# Patient Record
Sex: Male | Born: 1965 | Race: White | Hispanic: No | Marital: Married | Smoking: Never smoker
Health system: Southern US, Community
[De-identification: ages and names within clinical notes are randomized; demographics above are authoritative.]

## PROBLEM LIST (undated history)

## (undated) DIAGNOSIS — K8020 Calculus of gallbladder without cholecystitis without obstruction: Secondary | ICD-10-CM

## (undated) DIAGNOSIS — K219 Gastro-esophageal reflux disease without esophagitis: Secondary | ICD-10-CM

## (undated) HISTORY — PX: SHOULDER SURGERY: SHX246

## (undated) HISTORY — PX: HAND SURGERY: SHX662

## (undated) HISTORY — PX: VASECTOMY: SHX75

---

## 2000-10-23 ENCOUNTER — Encounter: Payer: Self-pay | Admitting: Emergency Medicine

## 2000-10-23 ENCOUNTER — Emergency Department (HOSPITAL_COMMUNITY): Admission: EM | Admit: 2000-10-23 | Discharge: 2000-10-23 | Payer: Self-pay | Admitting: Emergency Medicine

## 2002-12-17 ENCOUNTER — Ambulatory Visit (HOSPITAL_COMMUNITY): Admission: RE | Admit: 2002-12-17 | Discharge: 2002-12-17 | Payer: Self-pay | Admitting: Neurosurgery

## 2004-03-03 ENCOUNTER — Ambulatory Visit (HOSPITAL_BASED_OUTPATIENT_CLINIC_OR_DEPARTMENT_OTHER): Admission: RE | Admit: 2004-03-03 | Discharge: 2004-03-03 | Payer: Self-pay | Admitting: Orthopedic Surgery

## 2004-03-03 ENCOUNTER — Ambulatory Visit (HOSPITAL_COMMUNITY): Admission: RE | Admit: 2004-03-03 | Discharge: 2004-03-03 | Payer: Self-pay | Admitting: Orthopedic Surgery

## 2005-02-28 ENCOUNTER — Ambulatory Visit (HOSPITAL_COMMUNITY): Admission: RE | Admit: 2005-02-28 | Discharge: 2005-02-28 | Payer: Self-pay | Admitting: Family Medicine

## 2008-12-26 HISTORY — PX: OTHER SURGICAL HISTORY: SHX169

## 2011-01-16 ENCOUNTER — Encounter: Payer: Self-pay | Admitting: Orthopedic Surgery

## 2011-01-16 ENCOUNTER — Encounter: Payer: Self-pay | Admitting: Family Medicine

## 2013-01-03 ENCOUNTER — Ambulatory Visit: Payer: Self-pay | Admitting: Family Medicine

## 2013-01-03 ENCOUNTER — Ambulatory Visit: Payer: Self-pay

## 2013-01-03 VITALS — BP 119/63 | HR 88 | Temp 101.7°F | Resp 17 | Ht 68.0 in | Wt 207.0 lb

## 2013-01-03 DIAGNOSIS — R059 Cough, unspecified: Secondary | ICD-10-CM

## 2013-01-03 DIAGNOSIS — R05 Cough: Secondary | ICD-10-CM

## 2013-01-03 DIAGNOSIS — R509 Fever, unspecified: Secondary | ICD-10-CM

## 2013-01-03 DIAGNOSIS — J209 Acute bronchitis, unspecified: Secondary | ICD-10-CM

## 2013-01-03 LAB — POCT CBC
HCT, POC: 46.7 % (ref 43.5–53.7)
Lymph, poc: 3.1 (ref 0.6–3.4)
MCH, POC: 30.2 pg (ref 27–31.2)
MCHC: 33.2 g/dL (ref 31.8–35.4)
MCV: 90.9 fL (ref 80–97)
POC LYMPH PERCENT: 22.8 %L (ref 10–50)
RDW, POC: 13.8 %
WBC: 13.5 10*3/uL — AB (ref 4.6–10.2)

## 2013-01-03 LAB — POCT INFLUENZA A/B
Influenza A, POC: NEGATIVE
Influenza B, POC: NEGATIVE

## 2013-01-03 MED ORDER — AZITHROMYCIN 250 MG PO TABS: ORAL_TABLET | ORAL | Status: DC

## 2013-01-03 NOTE — Progress Notes (Signed)
[address]   [place], [place]  [address]   [phone]  Subjective:    Patient ID: Casey Hutchinson, male    DOB: 1966-02-11, 47 y.o.   MRN: [id number]  HPIThis 47 y.o. male presents for evaluation of fever, acute illness.  Onset five days ago.  Stomach bug 12/20/12 with vomiting, diarrhea x 5 days; all family members sick.  Felt better all week.  Onset of symptoms five days ago.  +fever Tmax 101.8.  +chills/sweats.  +HA.  No ear pain; mild sore throat.  +rhinorrhea; +nasal congestion. +coughing; +sore all over; +chest soreness.  +SOB with ambulation.  Coughing is worst symptom.  No flu vaccine.  Nyquil, Tylenol Cold.  PCP:  none   Review of Systems  Constitutional: Positive for fever, chills, diaphoresis and fatigue.  HENT: Positive for congestion, sore throat, rhinorrhea, voice change and postnasal drip. Negative for ear pain and trouble swallowing.   Respiratory: Positive for cough and shortness of breath.   Gastrointestinal: Negative for nausea, vomiting and diarrhea.  Skin: Negative for rash.  Neurological: Positive for headaches.        History reviewed. No pertinent past medical history.  Past Surgical History  Procedure Date  . Vasectomy     Prior to Admission medications   Medication Sig Start Date End Date Taking? Authorizing Provider  azithromycin (ZITHROMAX) 250 MG tablet Two tablets daily x 5 days 01/03/13   Ethelda Chick, MD    No Known Allergies  History   Social History  . Marital Status: Married    Spouse Name: N/A    Number of Children: N/A  . Years of Education: N/A   Occupational History  . Not on file.   Social History Main Topics  . Smoking status: Never Smoker   . Smokeless tobacco: Never Used  . Alcohol Use: No  . Drug Use: No  . Sexually Active: Not on file   Other Topics Concern  . Not on file   Social History Narrative  . No narrative on file    History reviewed. No pertinent family history.  Objective:   Physical Exam    Nursing note and vitals reviewed. Constitutional: He is oriented to person, place, and time. He appears well-developed and well-nourished.  Non-toxic appearance. He appears ill.  HENT:  Head: Normocephalic and atraumatic.  Right Ear: External ear normal.  Left Ear: External ear normal.  Nose: Nose normal.  Mouth/Throat: Oropharynx is clear and moist. No oropharyngeal exudate.  Eyes: Conjunctivae normal and EOM are normal. Pupils are equal, round, and reactive to light.  Neck: Normal range of motion. Neck supple.  Cardiovascular: Normal rate, regular rhythm and normal heart sounds.  Exam reveals no gallop and no friction rub.   No murmur heard. Pulmonary/Chest: Effort normal and breath sounds normal. He has no wheezes. He has no rales.  Lymphadenopathy:    He has cervical adenopathy.  Neurological: He is alert and oriented to person, place, and time.  Skin: Skin is warm and dry. No rash noted.  Psychiatric: He has a normal mood and affect. His behavior is normal. Judgment and thought content normal.    Results for orders placed in visit on 01/03/13  POCT INFLUENZA A/B      Component Value Range   Influenza A, POC Negative     Influenza B, POC Negative    POCT CBC      Component Value Range   WBC 13.5 (*) 4.6 - 10.2 K/uL  Lymph, poc 3.1  0.6 - 3.4   POC LYMPH PERCENT 22.8  10 - 50 %L   MID (cbc) 0.8  0 - 0.9   POC MID % 5.9  0 - 12 %M   POC Granulocyte 9.6 (*) 2 - 6.9   Granulocyte percent 71.3  37 - 80 %G   RBC 5.14  4.69 - 6.13 M/uL   Hemoglobin 15.5  14.1 - 18.1 g/dL   HCT, POC 65.7  84.6 - 53.7 %   MCV 90.9  80 - 97 fL   MCH, POC 30.2  27 - 31.2 pg   MCHC 33.2  31.8 - 35.4 g/dL   RDW, POC 96.2     Platelet Count, POC 254  142 - 424 K/uL   MPV 9.2  0 - 99.8 fL  GLUCOSE, POCT (MANUAL RESULT ENTRY)      Component Value Range   POC Glucose 108 (*) 70 - 99 mg/dl       [facility] reading (PRIMARY) by  Dr. Katrinka Blazing.  CXR: NAD.   Assessment & Plan:   1. Cough  POCT Influenza  A/B, POCT CBC, POCT glucose (manual entry), DG Chest 2 View  2. Fever  POCT Influenza A/B  3. Acute bronchitis       1.  Acute Bronchitis:  New.  Symptoms highly suggestive of influenza yet WBC count suggestive of bacterial etiology.  Treat empirically with Zithromax.  Supportive care with rest, fluids, Ibuprofen and/or Tylenol, Delsym.  No work with fever.  RTC for acute worsening.   Meds ordered this encounter  Medications  . azithromycin (ZITHROMAX) 250 MG tablet    Sig: Two tablets daily x 5 days    Dispense:  10 tablet    Refill:  0

## 2013-01-06 ENCOUNTER — Telehealth: Payer: Self-pay

## 2013-01-06 NOTE — Telephone Encounter (Signed)
Pt called stating that the over the counter cough medication is not working and would like something different called in   Best number [phone]

## 2013-01-07 ENCOUNTER — Telehealth: Payer: Self-pay

## 2013-01-07 NOTE — Telephone Encounter (Signed)
Wife is adding to the call to dr Katrinka Blazing that his lip is covered in cold sores would like to know if we can call in something for that also

## 2013-01-08 MED ORDER — HYDROCODONE-HOMATROPINE 5-1.5 MG/5ML PO SYRP: 5.0000 mL | ORAL_SOLUTION | Freq: Three times a day (TID) | ORAL | Status: DC | PRN

## 2013-01-08 NOTE — Telephone Encounter (Signed)
Faxed in Rx for cough med and [facility] for pt that it was done. Also advised them that since we have not treated pt for cold sores before, we can't send in a Rx, but he can try one of the OTC medications for this, or we will be happy to see him for this if he wants to RTC.

## 2013-01-08 NOTE — Telephone Encounter (Signed)
Hycodan prescription printed.  Does pt have a history of cold sores?  Have we treated this before?  There are OTC medications that may be helpful

## 2014-02-05 IMAGING — CR DG CHEST 2V
2 series · 2 of 2 positions shown · non-contrast
Comparison: 03/03/2004 and preliminary reading of Dr. Remache

CLINICAL DATA: Fever, cough

CHEST - 2 VIEW

[PA]
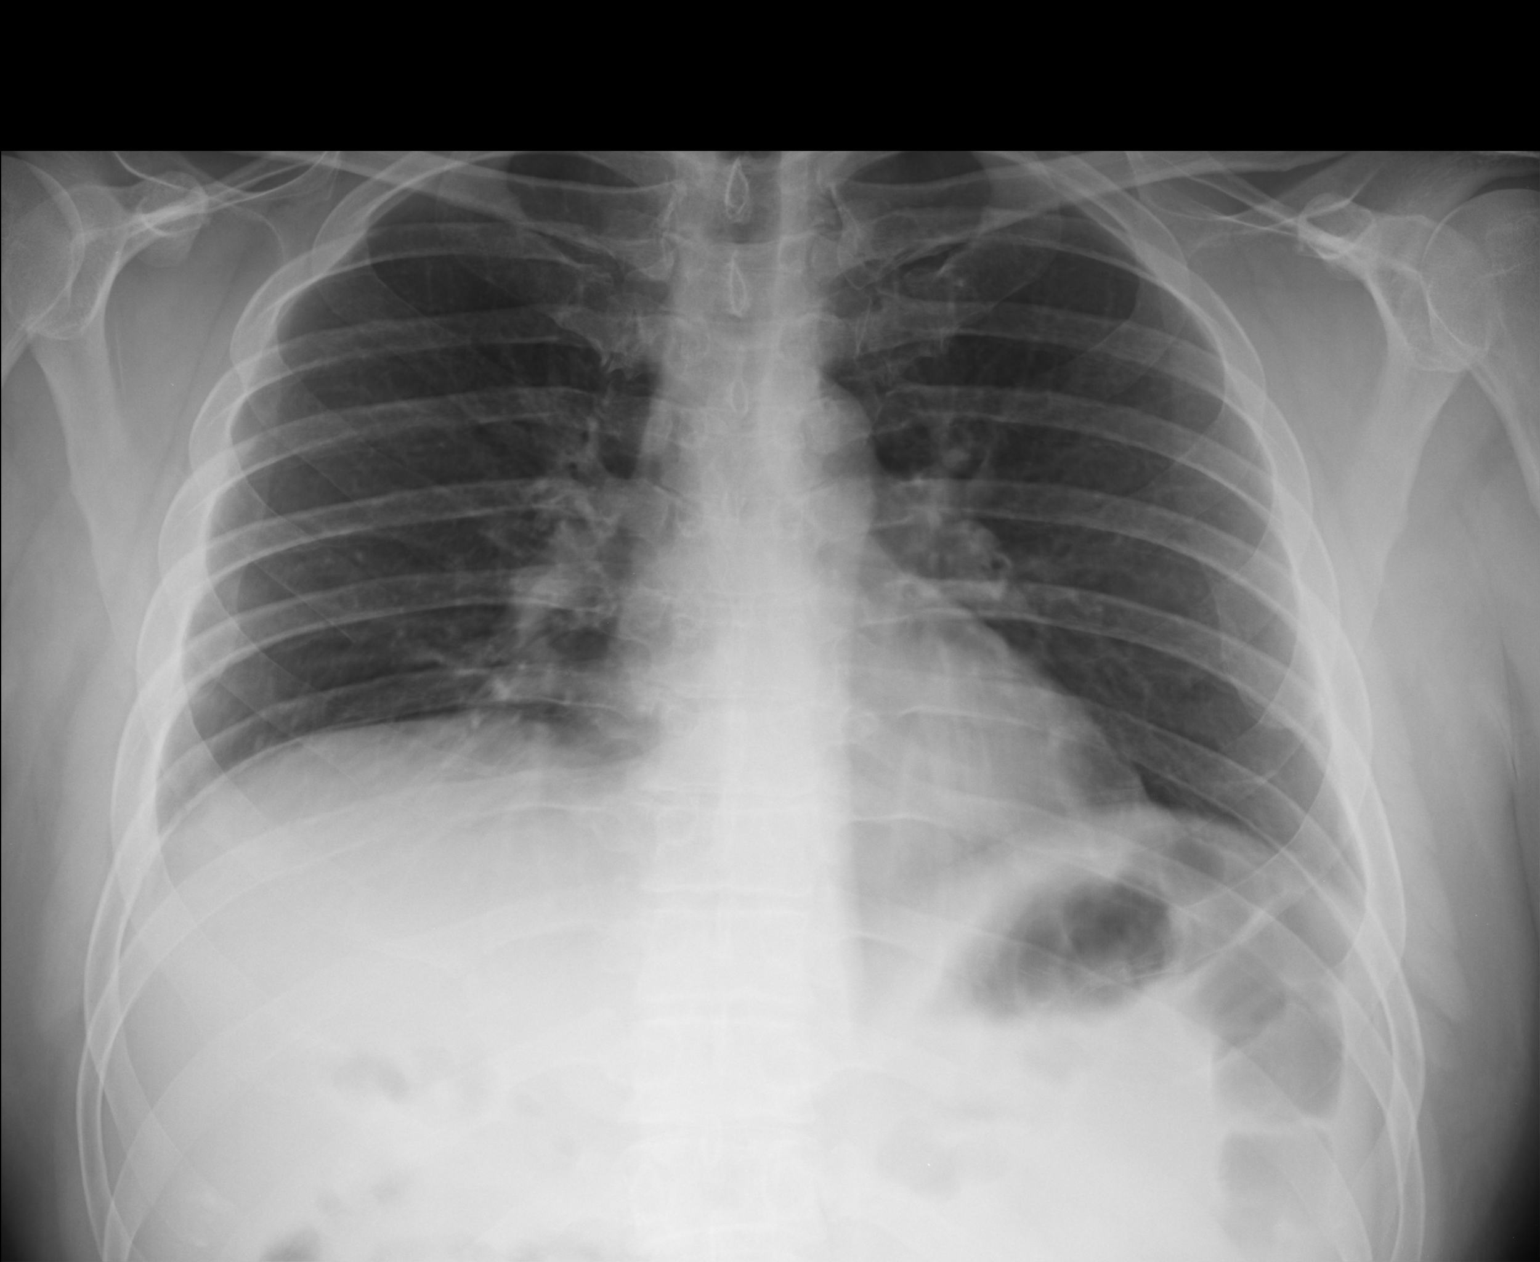

[lateral]
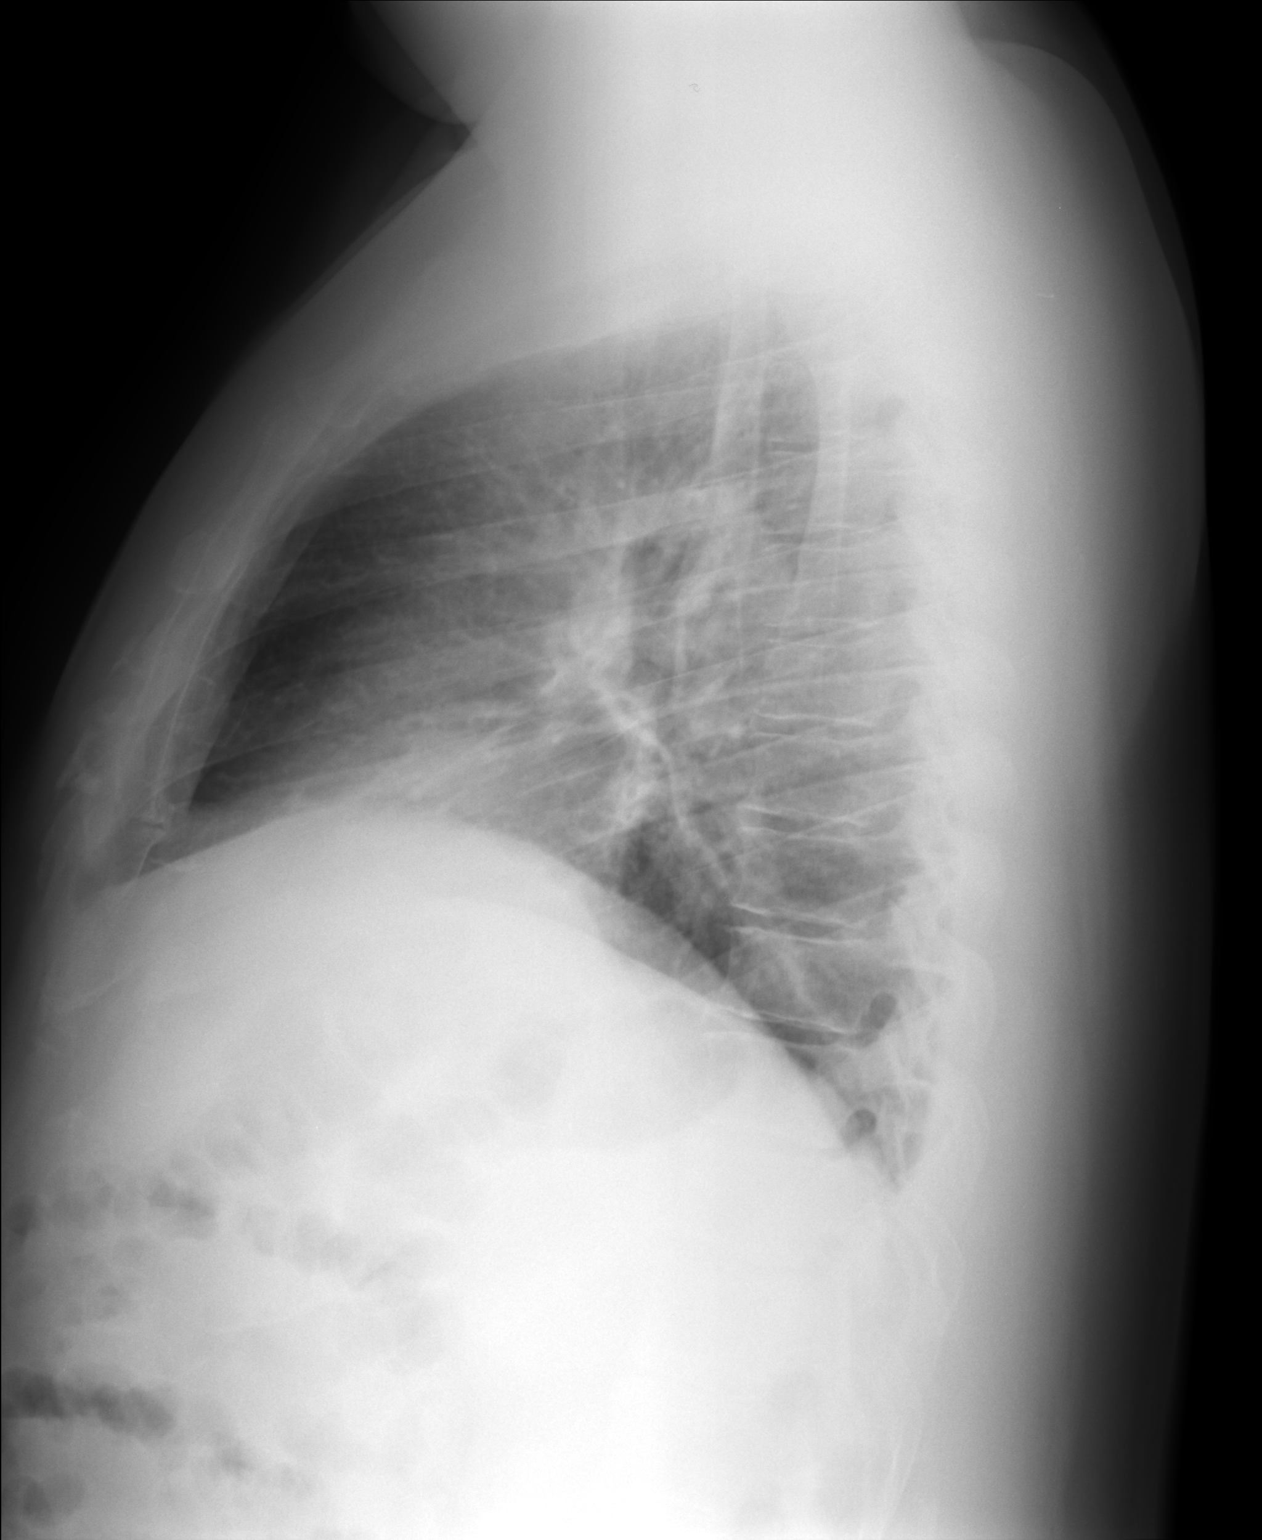

[2 of 2 positions shown; findings below may reference images not displayed]

FINDINGS: Cardiomediastinal silhouette is unremarkable.  No acute
infiltrate or pleural effusion.  No pulmonary edema.  Bony thorax
is unremarkable.
IMPRESSION: No active disease.

## 2014-05-18 ENCOUNTER — Emergency Department (HOSPITAL_BASED_OUTPATIENT_CLINIC_OR_DEPARTMENT_OTHER): Payer: 59

## 2014-05-18 ENCOUNTER — Emergency Department (HOSPITAL_BASED_OUTPATIENT_CLINIC_OR_DEPARTMENT_OTHER)
Admission: EM | Admit: 2014-05-18 | Discharge: 2014-05-18 | Disposition: A | Discharge status: Home / Self Care | Payer: 59 | Attending: Emergency Medicine | Admitting: Emergency Medicine

## 2014-05-18 ENCOUNTER — Encounter (HOSPITAL_BASED_OUTPATIENT_CLINIC_OR_DEPARTMENT_OTHER): Payer: Self-pay | Admitting: Emergency Medicine

## 2014-05-18 DIAGNOSIS — N23 Unspecified renal colic: Secondary | ICD-10-CM

## 2014-05-18 DIAGNOSIS — K8020 Calculus of gallbladder without cholecystitis without obstruction: Secondary | ICD-10-CM | POA: Insufficient documentation

## 2014-05-18 DIAGNOSIS — Z880 Allergy status to penicillin: Secondary | ICD-10-CM | POA: Insufficient documentation

## 2014-05-18 DIAGNOSIS — N200 Calculus of kidney: Secondary | ICD-10-CM | POA: Insufficient documentation

## 2014-05-18 LAB — CBC WITH DIFFERENTIAL/PLATELET
BASOS PCT: 0 % (ref 0–1)
Basophils Absolute: 0 10*3/uL (ref 0.0–0.1)
EOS ABS: 0.2 10*3/uL (ref 0.0–0.7)
Eosinophils Relative: 2 % (ref 0–5)
HCT: 43.2 % (ref 39.0–52.0)
Hemoglobin: 15.1 g/dL (ref 13.0–17.0)
Lymphocytes Relative: 35 % (ref 12–46)
Lymphs Abs: 2.6 10*3/uL (ref 0.7–4.0)
MCH: 31 pg (ref 26.0–34.0)
MCHC: 35 g/dL (ref 30.0–36.0)
MCV: 88.7 fL (ref 78.0–100.0)
MONO ABS: 0.5 10*3/uL (ref 0.1–1.0)
MONOS PCT: 7 % (ref 3–12)
NEUTROS PCT: 56 % (ref 43–77)
Neutro Abs: 4.1 10*3/uL (ref 1.7–7.7)
Platelets: 206 10*3/uL (ref 150–400)
RBC: 4.87 MIL/uL (ref 4.22–5.81)
RDW: 12.6 % (ref 11.5–15.5)
WBC: 7.3 10*3/uL (ref 4.0–10.5)

## 2014-05-18 LAB — COMPREHENSIVE METABOLIC PANEL
ALT: 46 U/L (ref 0–53)
AST: 36 U/L (ref 0–37)
Albumin: 4.1 g/dL (ref 3.5–5.2)
Alkaline Phosphatase: 51 U/L (ref 39–117)
BUN: 13 mg/dL (ref 6–23)
CALCIUM: 9.4 mg/dL (ref 8.4–10.5)
CO2: 24 meq/L (ref 19–32)
Chloride: 101 mEq/L (ref 96–112)
Creatinine, Ser: 0.9 mg/dL (ref 0.50–1.35)
GFR calc Af Amer: >90 mL/min (ref >90)
GFR calc non Af Amer: >90 mL/min (ref >90)
Glucose, Bld: 149 mg/dL — ABNORMAL HIGH (ref 70–99)
POTASSIUM: 4.4 meq/L (ref 3.7–5.3)
SODIUM: 139 meq/L (ref 137–147)
Total Bilirubin: 0.4 mg/dL (ref 0.3–1.2)
Total Protein: 7.6 g/dL (ref 6.0–8.3)

## 2014-05-18 LAB — URINALYSIS, ROUTINE W REFLEX MICROSCOPIC
Bilirubin Urine: NEGATIVE
Glucose, UA: NEGATIVE mg/dL
HGB URINE DIPSTICK: NEGATIVE
Ketones, ur: NEGATIVE mg/dL
LEUKOCYTES UA: NEGATIVE
NITRITE: NEGATIVE
PROTEIN: 30 mg/dL — AB
SPECIFIC GRAVITY, URINE: 1.027 (ref 1.005–1.030)
UROBILINOGEN UA: 0.2 mg/dL (ref 0.0–1.0)
pH: 5.5 (ref 5.0–8.0)

## 2014-05-18 LAB — URINE MICROSCOPIC-ADD ON

## 2014-05-18 LAB — TROPONIN I
Troponin I: <0.3 ng/mL (ref <0.30)
Troponin I: <0.3 ng/mL (ref <0.30)

## 2014-05-18 LAB — LIPASE, BLOOD: LIPASE: 56 U/L (ref 11–59)

## 2014-05-18 MED ORDER — ONDANSETRON HCL 4 MG/2ML IJ SOLN
4.0000 mg | Freq: Once | INTRAMUSCULAR | Status: AC
  Administered 2014-05-18: 4 mg via INTRAVENOUS
  Filled 2014-05-18: qty 2

## 2014-05-18 MED ORDER — ONDANSETRON HCL 4 MG PO TABS: 4.0000 mg | ORAL_TABLET | Freq: Four times a day (QID) | ORAL | Status: DC

## 2014-05-18 MED ORDER — ONDANSETRON HCL 4 MG/2ML IJ SOLN
4.0000 mg | Freq: Once | INTRAMUSCULAR | Status: AC
Start: 2014-05-18 — End: 2014-05-18
  Administered 2014-05-18: 4 mg via INTRAVENOUS
  Filled 2014-05-18: qty 2

## 2014-05-18 MED ORDER — HYDROMORPHONE HCL PF 1 MG/ML IJ SOLN: 0.5000 mg | Freq: Once | INTRAMUSCULAR | Status: DC

## 2014-05-18 MED ORDER — HYDROMORPHONE HCL PF 1 MG/ML IJ SOLN
1.0000 mg | Freq: Once | INTRAMUSCULAR | Status: DC
  Filled 2014-05-18: qty 1

## 2014-05-18 MED ORDER — IOHEXOL 300 MG/ML  SOLN
100.0000 mL | Freq: Once | INTRAMUSCULAR | Status: AC | PRN
  Administered 2014-05-18: 100 mL via INTRAVENOUS

## 2014-05-18 MED ORDER — MORPHINE SULFATE 4 MG/ML IJ SOLN
4.0000 mg | Freq: Once | INTRAMUSCULAR | Status: AC
  Administered 2014-05-18: 4 mg via INTRAVENOUS
  Filled 2014-05-18: qty 1

## 2014-05-18 MED ORDER — IOHEXOL 300 MG/ML  SOLN
50.0000 mL | Freq: Once | INTRAMUSCULAR | Status: AC | PRN
  Administered 2014-05-18: 50 mL via ORAL

## 2014-05-18 MED ORDER — HYDROMORPHONE HCL PF 1 MG/ML IJ SOLN
0.5000 mg | Freq: Once | INTRAMUSCULAR | Status: AC
  Administered 2014-05-18: 0.5 mg via INTRAVENOUS

## 2014-05-18 MED ORDER — HYDROCODONE-ACETAMINOPHEN 5-325 MG PO TABS: 1.0000 | ORAL_TABLET | Freq: Four times a day (QID) | ORAL | Status: DC | PRN

## 2014-05-18 NOTE — Discharge Instructions (Signed)
Please call and set up an appointment with general surgery-gallstones will need to be monitored closely Please call and set up an appointment with urology regarding kidney stones Please call and set up appointments with one of the providers on the list below for primary care physician Please rest and stay hydrated Please avoid any fat or greasy foods-please decreased intake of meats Please while on pain medications his be no drinking alcohol, driving, operating any heavy machinery if there is extra please disposer proper manner Please continue to monitor symptoms closely and if symptoms are to worsen or change (fever greater than 101, chills, chest pain, shortness of breath, difficulty breathing, numbness, tingling, worsening or changes to abdominal pain, pain in the right upper quadrant of the abdomen, changes to skin colored, yellowish tinge to skin color, inability to keep food or fluid down, blood in the stools, black tarry stools, diarrhea, weakness, fainting, dizziness, blurred vision, sudden loss of vision)   Abdominal Pain, Adult Many things can cause belly (abdominal) pain. Most times, the belly pain is not dangerous. Many cases of belly pain can be watched and treated at home. HOME CARE   Do not take medicines that help you go poop (laxatives) unless told to by your doctor.  Only take medicine as told by your doctor.  Eat or drink as told by your doctor. Your doctor will tell you if you should be on a special diet. GET HELP IF:  You do not know what is causing your belly pain.  You have belly pain while you are sick to your stomach (nauseous) or have runny poop (diarrhea).  You have pain while you pee or poop.  Your belly pain wakes you up at night.  You have belly pain that gets worse or better when you eat.  You have belly pain that gets worse when you eat fatty foods. GET HELP RIGHT AWAY IF:   The pain does not go away within 2 hours.  You have a fever.  You keep  throwing up (vomiting).  The pain changes and is only in the right or left part of the belly.  You have bloody or tarry looking poop. MAKE SURE YOU:   Understand these instructions.  Will watch your condition.  Will get help right away if you are not doing well or get worse. Document Released: 05/30/2008 Document Revised: 10/02/2013 Document Reviewed: 08/21/2013 [facility] Patient Information 2014 [place], [place].  Cholelithiasis Cholelithiasis (also called gallstones) is a form of gallbladder disease in which gallstones form in your gallbladder. The gallbladder is an organ that stores bile made in the liver, which helps digest fats. Gallstones begin as small crystals and slowly grow into stones. Gallstone pain occurs when the gallbladder spasms and a gallstone is blocking the duct. Pain can also occur when a stone passes out of the duct.  RISK FACTORS  Being male.   Having multiple pregnancies. Health care providers sometimes advise removing diseased gallbladders before future pregnancies.   Being obese.  Eating a diet heavy in fried foods and fat.   Being older than 60 years and increasing age.   Prolonged use of medicines containing male hormones.   Having diabetes mellitus.   Rapidly losing weight.   Having a family history of gallstones (heredity).  SYMPTOMS  Nausea.   Vomiting.  Abdominal pain.   Yellowing of the skin (jaundice).   Sudden pain. It may persist from several minutes to several hours.  Fever.   Tenderness to the touch. In  some cases, when gallstones do not move into the bile duct, people have no pain or symptoms. These are called "silent" gallstones.  TREATMENT Silent gallstones do not need treatment. In severe cases, emergency surgery may be required. Options for treatment include:  Surgery to remove the gallbladder. This is the most common treatment.  Medicines. These do not always work and may take 6 12 months or more to  work.  Shock wave treatment (extracorporeal biliary lithotripsy). In this treatment an ultrasound machine sends shock waves to the gallbladder to break gallstones into smaller pieces that can pass into the intestines or be dissolved by medicine. HOME CARE INSTRUCTIONS   Only take over-the-counter or prescription medicines for pain, discomfort, or fever as directed by your health care provider.   Follow a low-fat diet until seen again by your health care provider. Fat causes the gallbladder to contract, which can result in pain.   Follow up with your health care provider as directed. Attacks are almost always recurrent and surgery is usually required for permanent treatment.  SEEK IMMEDIATE MEDICAL CARE IF:   Your pain increases and is not controlled by medicines.   You have a fever or persistent symptoms for more than 2 3 days.   You have a fever and your symptoms suddenly get worse.   You have persistent nausea and vomiting.  MAKE SURE YOU:   Understand these instructions.  Will watch your condition.  Will get help right away if you are not doing well or get worse. Document Released: 12/08/2005 Document Revised: 08/14/2013 Document Reviewed: 06/05/2013 [facility] Patient Information 2014 [place]. Ureteral Colic Ureteral colic is spasm-like pain from the kidney or the ureter. This is often caused by a kidney stone. The pain is caused by the stone trying to get through the tubes that pass your pee. HOME CARE   Drink enough fluids to keep your pee (urine) clear or pale yellow.  Strain all your pee. A strainer will be provided. Keep anything caught in the strainer and bring it to your doctor. The stone causing the pain may be very small.  Only take medicine as told by your doctor.  Follow up with your doctor as told. GET HELP RIGHT AWAY IF:   Pain is not controlled with medicine.  Pain continues or gets worse.  The pain changes and there is chest or belly  (abdominal) pain.  You pass out (faint).  You cannot pee.  You keep throwing up (vomiting).  You have a temperature by mouth above 102 F (38.9 C), not controlled by medicine. MAKE SURE YOU:   Understand these instructions.  Will watch this condition.  Will get help right away if you are not doing well or get worse. Document Released: 05/30/2008 Document Revised: 03/05/2012 Document Reviewed: 05/30/2008 [facility] Patient Information 2014 [place], [place].   Emergency Department Resource Guide 1) Find a Doctor and Pay Out of Pocket Although you won't have to find out who is covered by your insurance plan, it is a good idea to ask around and get recommendations. You will then need to call the office and see if the doctor you have chosen will accept you as a new patient and what types of options they offer for patients who are self-pay. Some doctors offer discounts or will set up payment plans for their patients who do not have insurance, but you will need to ask so you aren't surprised when you get to your appointment.  2) Contact Your Local Health Department  Not all health departments have doctors that can see patients for sick visits, but many do, so it is worth a call to see if yours does. If you don't know where your local health department is, you can check in your phone book. The CDC also has a tool to help you locate your state's health department, and many state websites also have listings of all of their local health departments.  3) Find a Walk-in Clinic If your illness is not likely to be very severe or complicated, you may want to try a walk in clinic. These are popping up all over the country in pharmacies, drugstores, and shopping centers. They're usually staffed by nurse practitioners or physician assistants that have been trained to treat common illnesses and complaints. They're usually fairly quick and inexpensive. However, if you have serious medical issues or chronic  medical problems, these are probably not your best option.  No Primary Care Doctor: - Call Health Connect at  [phone] - they can help you locate a primary care doctor that  accepts your insurance, provides certain services, etc. - Physician Referral Service- [phone]  Chronic Pain Problems: Organization         Address  Phone   Notes  [facility]  [phone] Patients need to be referred by their primary care doctor.   Medication Assistance: Organization         Address  Phone   Notes  [facility] Medication [facility] [address] [place] [place], [place] [address] [phone] --Must be a resident of [facility] -- Must have NO insurance coverage whatsoever (no Medicaid/ Medicare, etc.) -- The pt. MUST have a primary care doctor that directs their care regularly and follows them in the community   Med[facility]ist  [phone]   [employer]  [phone]    Agencies that provide inexpensive medical care: Organization         Address  Phone   Notes  [facility]  [phone]   [facility]    [phone]   [facility] [address] [place], [place] [address] [phone]   [facility] [address], [place] [phone]   Planned Parenthood    [phone]   [facility]    [phone]   Community [facility] and [facility]  [address], [place] Phone:  [phone], Fax:  [phone] Hours of Operation:  9 am - 6 pm, M-F.  Also accepts Medicaid/Medicare and self-pay.  [facility]  [address], [place] Phone: [phone], Fax: [phone]. Hours of Operation:  8:30 am - 5:30 pm, M-F.  Also accepts Medicaid and self-pay.  [facility] [place] [address] [place] Phone: [phone]   [facility] [address] Natasha Bence [place], [place] [phone],  Ext. [phone] Mondays & Thursdays: 7-9 AM.  First 15 patients are seen on a first come, first serve basis.    Medicaid-accepting [facility] Providers:  Organization         Address  Phone   Notes  [facility] [address],  [phone] Also accepts self-pay patients.  [facility] [address] Laurell Josephs [place], [place]  [phone]   [facility] [address], [place] [phone]  [facility] [address] [place] [phone]   Renaye Rakers [address], [place]   [phone] Only accepts [place] patients after they have their name applied to their card.   Self-Pay (no insurance) in [facility]:  Organization         Address  Phone   Notes  Sickle Cell Patients, [facility] [address] [place], [place] [phone]   [facility] [address] [place], [place] [phone]   [facility]  [address],  [phone]   [facility]/Dr. Osei-Bonsu  [address] [place] or [address], [place] [phone] Phone number for both [place] and [place] locations is the same.  [facility]gent [facility] and [facility] [address] [place] [phone]   [facility] [address], [place] or [address] [phone]   [facility] [address] [place] [phone], phone; [phone], fax Sees patients 1st and 3rd Saturday of every month.  Must not qualify for public or private insurance (i.e. Medicaid, Medicare, [place] Health Choice, Veterans' Benefits)  Household income should be no more than 200% of the poverty level The clinic cannot treat you if you are pregnant or think you are pregnant  Sexually transmitted diseases are not  treated at the clinic.    Dental Care: Organization         Address  Phone  Notes  [facility] Department of [facility] [address] [place], [place] [phone] Accepts children up to age 22 who are enrolled in [place] or [place] Health Choice; pregnant women with a Medicaid card; and children who have applied for Medicaid or [place] Health Choice, but were declined, whose parents can pay a reduced fee at time of service.  [facility] Department of [facility]  [address], [place] [phone] Accepts children up to age 19 who are enrolled in [place] or [place] Health Choice; pregnant women with a Medicaid card; and children who have applied for Medicaid or [place] Health Choice, but were declined, whose parents can pay a reduced fee at time of service.  [facility]  [address] [place], [place] [phone] Patients are seen by appointment only. Walk-ins are not accepted. [facility] will see patients 40 years of age and older. Monday - Tuesday (8am-5pm) Most Wednesdays (8:30-5pm) $30 per visit, cash only  [facility]  [address], [facility] [phone] Patients are seen by appointment only. Walk-ins are not accepted. [facility] will see patients 102 years of age and older. One Wednesday Evening (Monthly: Volunteer Based).  $30 per visit, cash only  [employer]  [phone] for adults; Children under age 21, call Graduate Pediatric Dentistry at [phone]. Children aged 93-14, please call [phone] to request a pediatric application.  Dental services are provided in all areas of dental care including fillings, crowns and bridges, complete and partial dentures, implants, gum treatment, root canals, and extractions. Preventive care is also provided. Treatment is provided to both adults and children. Patients are selected via a lottery and there is  often a waiting list.   [facility] [address] [place]  [phone] [email or web address]  [facility] [address], [place], [place] [phone], Ext. [phone] Second and Fourth Thursday of each month, opens at 6:30 AM; Clinic ends at 9 AM.  Patients are seen on a first-come first-served basis, and a limited number are seen during each clinic.   [facility]  [address], [place], [place] [phone]   Eligibility Requirements You must have lived in [place], [place], or [place] counties for at least the last three months.   You cannot be eligible for state or federal sponsored [place] Cityhealthcare insurance, including CIGNA[place]eterans Administration, [place]icaid, or [employer]Medicare.   You generally cannot be eligible for healthcare insurance through your employer.    How to apply: Eligibility screenings are held every Tuesday and Wednesday afternoon from 1:00 pm until 4:00 pm. You do not need an appointment for the interview!  [facility] [address], [place], [place] [phone]   [facility]  [phone]   [facility]  [phone]   [facility]  [phone]    Behavioral Health Resources in the Community: Intensive Outpatient Programs Organization         Address  Phone  Notes  [facility] [address], [place], [place] [phone]   [facility] [address], [place], [place] [phone]   [facility] [address], [place], [place]  [phone]   [facility] [address],  [place], [place] [phone] or [phone]   Substance Abuse Resources Organization         Address  Phone  Notes  [facility]  [phone]   [facility]  [phone]   [facility]  [phone]   [facility]  [phone]   Residential & Outpatient Substance  Abuse Program  [phone]   Psychological Services Organization         Address  Phone  Notes  [facility]  [phone]- [phone]   [facility]  [phone]   [facility] [address], [place] [phone] or [phone]    Mobile Crisis Teams Organization         Address  Phone  Notes  [facility], Mobile Crisis Care Unit  [phone]   [facility]  [address] [place], [place] [phone]   Doristine LocksSharon DeEsch [address] [place] [phone]    Self-Help/Support Groups Organization         Address  Phone             Notes  [facility] [place] - variety of support groups  336- [phone] Call for more information  Narcotics Anonymous (NA), [facility] [address], [place]  2 meetings at this location   Statisticianesidential Treatment Programs Organization         Address  Phone  Notes  [facility] [address],    [place]  [phone]   [facility]  [address], [place] [phone], [place], [place] [phone]   [facility] [address], [place] [phone] Admissions: 8am-3pm M-F  [facility] [address]    [place], [place] [phone]   [facility] [address], [place], [place] [phone]   [facility] [address]  [place], [place] [phone]   [facility] - Intensive Outpatient [phone]  [address] [place], [place] [phone]   [facility] ([facility] [address]  [place], [place] [phone] or [phone]   [facility] ([facility]) [address] [place], [place] [phone] Accepts Medicaid  [facility] [address]  [place] [phone] Substance Abuse/Addiction Treatment   [facility] Organization          Address  Phone  Notes  [facility]  [phone]   Angie Fava, PhD [address] Ervin Knack [place], [place]   [phone] or [phone]   [facility]   [address] [place], [place] [phone]   [facility] [address] [place], [place] [phone] Insurance/Medicaid/sponsorship through [facility] and Families [address]                                    [place], [place] [phone] Therapy/tele-psych/case  [facility] [address], [place] [phone]    Dr. Lolly Mustache  [phone]   [facility]  [employer] [facility] Dept. 1) [address], [place] 2) [address], [place] 3)  [address], [place] [phone]  [phone]   [facility] Child Abuse Hotline [phone] or [phone] (After Hours)

## 2014-05-18 NOTE — ED Provider Notes (Signed)
The left upper quadrant pain since 4:30 AM today accompanied by mild nausea. He made himself vomit one time this morning. He had one episode of diarrhea today. No other associated symptoms. No fever. No treatment prior to coming in nothing makes symptoms better or worse. No chest pain no shortness of breath no sweatiness. Symptoms nonexertional On exam no distress heart regular rate and rhythm no murmurs lungs clear auscultation abdomen nondistended decreased bowel sounds tender left upper quadrant no guarding no rigidity no rebound genitalia normal male  Doug Sou, MD 05/18/14 1346

## 2014-05-18 NOTE — ED Notes (Signed)
Patient to radiology at this time.

## 2014-05-18 NOTE — ED Notes (Signed)
Patient was still c/o nausea.  Order received and administered.

## 2014-05-18 NOTE — ED Notes (Signed)
Patient here with epigastric pain that started this am 0400, no nausea on assessment but vomited x 1 early am. Had diarrhea earlier but now resolved. Reports that he feels bloated.

## 2014-05-18 NOTE — ED Notes (Signed)
Fluid Challenge. Ginger ale given. Pt tolerating well at this time.

## 2014-05-18 NOTE — ED Notes (Signed)
Patient is resting comfortably. 

## 2014-05-18 NOTE — ED Provider Notes (Signed)
CSN: [id number]     Arrival date & time 05/18/14  1138 History   First MD Initiated Contact with Patient 05/18/14 1201     Chief Complaint  Patient presents with  . Abdominal Pain     (Consider location/radiation/quality/duration/timing/severity/associated sxs/prior Treatment) The history is provided by the patient. No language interpreter was used.  Casey Hutchinson is a 48 year old male with no known significant past medical history presenting to the ED with abdominal pain that started approximately 4:00 AM this morning. Reported that the pain is localized to left upper quadrant as described as a constant pain rating pain 10 out of 10 that radiates towards his back. Stated he's use ibuprofen with minimal relief. Reported nausea and one episode of emesis earlier this morning-NB/NB. Reported one episode of diarrhea this morning. Reported that he was fine without any discomfort yesterday. Denied fever, chills, chest pain, shortness of breath, difficulty breathing, hematuria, melena, hematochezia, abdominal surgery. PCP none  History reviewed. No pertinent past medical history. Past Surgical History  Procedure Laterality Date  . Vasectomy     No family history on file. History  Substance Use Topics  . Smoking status: Never Smoker   . Smokeless tobacco: Never Used  . Alcohol Use: No    Review of Systems  Constitutional: Negative for fever and chills.  Respiratory: Negative for chest tightness and shortness of breath.   Cardiovascular: Negative for chest pain.  Gastrointestinal: Positive for nausea, vomiting and abdominal pain. Negative for diarrhea, constipation, blood in stool and anal bleeding.  Musculoskeletal: Negative for back pain and neck pain.  Neurological: Negative for dizziness and weakness.  All other systems reviewed and are negative.     Allergies  Penicillins  Home Medications   Prior to Admission medications   Not on File   BP 130/95  Pulse 59  Temp(Src)  98.2 F (36.8 C) (Oral)  Resp 16  Wt 210 lb (95.255 kg)  SpO2 97% Physical Exam  Nursing note and vitals reviewed. Constitutional: He is oriented to person, place, and time. He appears well-developed and well-nourished. No distress.  HENT:  Head: Normocephalic and atraumatic.  Mouth/Throat: Oropharynx is clear and moist. No oropharyngeal exudate.  Eyes: Conjunctivae and EOM are normal. Pupils are equal, round, and reactive to light. Right eye exhibits no discharge. Left eye exhibits no discharge.  Neck: Normal range of motion. Neck supple. No tracheal deviation present.  Cardiovascular: Normal rate, regular rhythm and normal heart sounds.   No murmur heard. Pulmonary/Chest: Effort normal and breath sounds normal. No respiratory distress. He has no wheezes. He has no rales.  Abdominal: Soft. Normal appearance and bowel sounds are normal. He exhibits no distension and no ascites. There is tenderness in the left upper quadrant. There is no rebound, no guarding and no CVA tenderness.  Negative abdominal distention Bowel sounds normoactive in all 4 quadrants Abdomen soft upon palpation Mild discomfort upon palpation to left upper quadrant Negative peritoneal signs Negative guarding or rigidity identified to the abdomen Negative CVA tenderness bilaterally  Musculoskeletal: Normal range of motion.  Full ROM to upper and lower extremities without difficulty noted, negative ataxia noted.  Lymphadenopathy:    He has no cervical adenopathy.  Neurological: He is alert and oriented to person, place, and time. No cranial nerve deficit. He exhibits normal muscle tone. Coordination normal.  Skin: Skin is warm and dry. No rash noted. He is not diaphoretic. No erythema.  Psychiatric: He has a normal mood and affect. His behavior is  normal. Thought content normal.    ED Course  Procedures (including critical care time)  1:43 PM Patient seen and assessed by attending physician - Dr. Lyn Hollingshead. As  per physician recommended CT abdomen and pelvis with contrast.   4:40 PM This provider reassess the patient. Patient reports that he is pain-free. Discussed labs and imaging in great detail.  Results for orders placed during the hospital encounter of 05/18/14  URINALYSIS, ROUTINE W REFLEX MICROSCOPIC      Result Value Ref Range   Color, Urine YELLOW  YELLOW   APPearance CLEAR  CLEAR   Specific Gravity, Urine 1.027  1.005 - 1.030   pH 5.5  5.0 - 8.0   Glucose, UA NEGATIVE  NEGATIVE mg/dL   Hgb urine dipstick NEGATIVE  NEGATIVE   Bilirubin Urine NEGATIVE  NEGATIVE   Ketones, ur NEGATIVE  NEGATIVE mg/dL   Protein, ur 30 (*) NEGATIVE mg/dL   Urobilinogen, UA 0.2  0.0 - 1.0 mg/dL   Nitrite NEGATIVE  NEGATIVE   Leukocytes, UA NEGATIVE  NEGATIVE  LIPASE, BLOOD      Result Value Ref Range   Lipase 56  11 - 59 U/L  COMPREHENSIVE METABOLIC PANEL      Result Value Ref Range   Sodium 139  137 - 147 mEq/L   Potassium 4.4  3.7 - 5.3 mEq/L   Chloride 101  96 - 112 mEq/L   CO2 24  19 - 32 mEq/L   Glucose, Bld 149 (*) 70 - 99 mg/dL   BUN 13  6 - 23 mg/dL   Creatinine, Ser 0.90  0.50 - 1.35 mg/dL   Calcium 9.4  8.4 - 10.5 mg/dL   Total Protein 7.6  6.0 - 8.3 g/dL   Albumin 4.1  3.5 - 5.2 g/dL   AST 36  0 - 37 U/L   ALT 46  0 - 53 U/L   Alkaline Phosphatase 51  39 - 117 U/L   Total Bilirubin 0.4  0.3 - 1.2 mg/dL   GFR calc non Af Amer >90  >90 mL/min   GFR calc Af Amer >90  >90 mL/min  CBC WITH DIFFERENTIAL      Result Value Ref Range   WBC 7.3  4.0 - 10.5 K/uL   RBC 4.87  4.22 - 5.81 MIL/uL   Hemoglobin 15.1  13.0 - 17.0 g/dL   HCT 43.2  39.0 - 52.0 %   MCV 88.7  78.0 - 100.0 fL   MCH 31.0  26.0 - 34.0 pg   MCHC 35.0  30.0 - 36.0 g/dL   RDW 12.6  11.5 - 15.5 %   Platelets 206  150 - 400 K/uL   Neutrophils Relative % 56  43 - 77 %   Neutro Abs 4.1  1.7 - 7.7 K/uL   Lymphocytes Relative 35  12 - 46 %   Lymphs Abs 2.6  0.7 - 4.0 K/uL   Monocytes Relative 7  3 - 12 %   Monocytes  Absolute 0.5  0.1 - 1.0 K/uL   Eosinophils Relative 2  0 - 5 %   Eosinophils Absolute 0.2  0.0 - 0.7 K/uL   Basophils Relative 0  0 - 1 %   Basophils Absolute 0.0  0.0 - 0.1 K/uL  URINE MICROSCOPIC-ADD ON      Result Value Ref Range   Bacteria, UA FEW (*) RARE   Crystals CA OXALATE CRYSTALS (*) NEGATIVE   Urine-Other MUCOUS PRESENT    TROPONIN  I      Result Value Ref Range   Troponin I <0.30  <0.30 ng/mL  TROPONIN I      Result Value Ref Range   Troponin I <0.30  <0.30 ng/mL    Labs Review Labs Reviewed  URINALYSIS, ROUTINE W REFLEX MICROSCOPIC - Abnormal; Notable for the following:    Protein, ur 30 (*)    All other components within normal limits  COMPREHENSIVE METABOLIC PANEL - Abnormal; Notable for the following:    Glucose, Bld 149 (*)    All other components within normal limits  URINE MICROSCOPIC-ADD ON - Abnormal; Notable for the following:    Bacteria, UA FEW (*)    Crystals CA OXALATE CRYSTALS (*)    All other components within normal limits  LIPASE, BLOOD  CBC WITH DIFFERENTIAL  TROPONIN I  TROPONIN I    Imaging Review US Abdomen Complete  05/18/2014   CLINICAL DATA:  Right upper quadrant pain.  Nausea and vomiting.  EXAM: ULTRASOUND ABDOMEN COMPLETE  COMPARISON:  CT abdomen earlier today.  FINDINGS: Gallbladder:  Echogenic focus at the neck of the gallbladder measuring 5 mm consistent with an gallstones. Wall thickness 2.8 mm. Negative sonographic Murphy's sign.  Common bile duct:  Diameter: Unable to measure due to large amount of bowel gas.  Liver:  Diffuse increased echogenicity without focal defects. Findings consistent with steatosis.  IVC:  Limited visualization.  No definite abnormalities.  Pancreas:  Obscured due to bowel gas.  Spleen:  Size and appearance within normal limits.  Right Kidney:  Length: 11.7 cm. Echogenicity within normal limits. No mass or hydronephrosis visualized.  Left Kidney:  Length: 11.7 cm. Echogenicity within normal limits. No mass or  hydronephrosis visualized.  Abdominal aorta:  No aneurysm visualized.  Other findings:  None.  Good agreement with prior CT.  IMPRESSION: Cholelithiasis without signs of acute cholecystitis. No pericholecystic fluid or gallbladder wall thickening. Negative sonographic Murphy's sign.  No visualized intrahepatic biliary ductal dilatation. Extrahepatic biliary tree not well seen due to gas.  Increased liver echogenicity consistent with steatosis.   Electronically Signed   By: Rolla Flatten M.D.   On: 05/18/2014 16:10   Ct Abdomen Pelvis W Contrast  05/18/2014   CLINICAL DATA:  48 year old male with left-sided abdominal and pelvic pain.  EXAM: CT ABDOMEN AND PELVIS WITH CONTRAST  TECHNIQUE: Multidetector CT imaging of the abdomen and pelvis was performed using the standard protocol following bolus administration of intravenous contrast.  CONTRAST:  142m OMNIPAQUE IOHEXOL 300 MG/ML  SOLN  COMPARISON:  02/28/2005 CT  FINDINGS: Diffuse fatty infiltration of the liver is again noted. No focal hepatic abnormalities are identified.  A 5 mm per gallstone at the gallbladder neck is identified without definite CT evidence of acute cholecystitis.  The spleen, pancreas, adrenal glands and kidneys are unremarkable.  There is no evidence of free fluid, enlarged lymph nodes, biliary dilation or abdominal aortic aneurysm.  The bowel, bladder and appendix are unremarkable. There is no evidence of bowel obstruction, abscess or pneumoperitoneum.  No acute or suspicious bony abnormalities are present. Moderate degenerative changes at L5-S1 noted.  IMPRESSION: 5 mm gallstone at the gallbladder neck without CT evidence of acute cholecystitis. If there is strong clinical suspicion for acute cholecystitis, consider ultrasound or nuclear medicine study for further evaluation.  Diffuse fatty infiltration of the liver.   Electronically Signed   By: JHassan RowanM.D.   On: 05/18/2014 15:07     EKG Interpretation   Date/Time:  Sunday May 18 2014 11:56:40 EDT Ventricular Rate:  59 PR Interval:  144 QRS Duration: 84 QT Interval:  414 QTC Calculation: 409 R Axis:   3 Text Interpretation:  Sinus bradycardia Otherwise normal ECG Since last  tracing rate slower Confirmed by Winfred Leeds  MD, SAM [phone]) on 05/18/2014  12:21:57 PM      MDM   Final diagnoses:  Cholelithiasis  Renal colic   Medications  morphine 4 MG/ML injection 4 mg (4 mg Intravenous Given 05/18/14 1303)  ondansetron (ZOFRAN) injection 4 mg (4 mg Intravenous Given 05/18/14 1303)  ondansetron (ZOFRAN) injection 4 mg (4 mg Intravenous Given 05/18/14 1350)  HYDROmorphone (DILAUDID) injection 0.5 mg (0.5 mg Intravenous Given 05/18/14 1350)  iohexol (OMNIPAQUE) 300 MG/ML solution 50 mL (50 mLs Oral Contrast Given 05/18/14 1452)  iohexol (OMNIPAQUE) 300 MG/ML solution 100 mL (100 mLs Intravenous Contrast Given 05/18/14 1452)  ondansetron (ZOFRAN) injection 4 mg (4 mg Intravenous Given 05/18/14 1657)    Filed Vitals:   05/18/14 1150 05/18/14 1402 05/18/14 1636  BP: 147/87 127/82 130/95  Pulse: 70 56 59  Temp: 98.4 F (36.9 C)  98.2 F (36.8 C)  TempSrc:   Oral  Resp: _0 Weight: 210 lb (95.255 kg)    SpO2: 100% 96% 97%    EKG noted sinus bradycardia with a heart rate of 59 beats per minute. Troponin negative elevation. Second troponin negative elevation. CBC negative elevated white blood cell count is-negative left shift or leukocytosis noted. CMP noted probably functioning kidneys and liver. Electrolytes balanced. Lipase negative elevation. Urinalysis negative for nitrites, leukocytes, hemoglobin. Urine noted oxalate crystals. CT abdomen and pelvis noted a 5 mm gallstone at the gallbladder neck without CT evidence of acute cholecystitis-recommended ultrasound to be performed. Diffuse fatty infiltration of the liver noted. Abdominal ultrasound identified cholelithiasis without signs of acute cholecystitis-no pericholecystic fluid or gallbladder wall thickening  noted with negative findings of Murphy sign. No visualized intrahepatic biliary ductal dilation, extrahepatic biliary tree not well seen due to gas, increased liver echogenicity consistent with stenosis. Doubt cholangitis. Doubt pancreatitis. Doubt appendicitis. Doubt acute abdominal processes. Doubt cholecystitis-gallstones noted but patient afebrile, negative elevated white blood cell count, negative elevated liver enzymes, negative elevated bilirubin, negative elevated alk phos, no Murphy sign noted. Patient presents with incidental cholelithiasis. Cannot rule out possible kidney stones secondary to oxalate crystals identified in the urine. Pain controlled in ED setting. Patient stable, afebrile. Patient does not appear septic. Patient tolerated fluids by mouth without episodes of emesis while in ED setting. Discharged patient. Referred patient to [facility], surgery, gastroenterology. Discussed with patient to rest and stay hydrated. Discussed with patient proper diet. Discussed with patient close monitoring of gallbladder and consequences of cholecystitis that can be fatal. Discharged patient with pain medications and antiemetics. Discussed with patient to closely monitor symptoms and if symptoms are to worsen or change to report back to the ED - strict return instructions given.  Patient agreed to plan of care, understood, all questions answered.   Jamse Mead, PA-C 05/19/14 0126

## 2014-05-18 NOTE — ED Notes (Signed)
Family at bedside. 

## 2014-05-18 NOTE — ED Notes (Signed)
Patient transported to CT 

## 2014-05-19 NOTE — ED Provider Notes (Signed)
Medical screening examination/treatment/procedure(s) were conducted as a shared visit with non-physician practitioner(s) and myself.  I personally evaluated the patient during the encounter.   EKG Interpretation   Date/Time:  Sunday May 18 2014 11:56:40 EDT Ventricular Rate:  59 PR Interval:  144 QRS Duration: 84 QT Interval:  414 QTC Calculation: 409 R Axis:   3 Text Interpretation:  Sinus bradycardia Otherwise normal ECG Since last  tracing rate slower Confirmed by Ethelda Chick  MD,  [phone]) on 05/18/2014  12:21:57 PM       Doug Sou, MD 05/19/14 [phone]

## 2014-06-06 ENCOUNTER — Ambulatory Visit (INDEPENDENT_AMBULATORY_CARE_PROVIDER_SITE_OTHER): Payer: 59 | Admitting: Surgery

## 2014-07-22 ENCOUNTER — Other Ambulatory Visit (HOSPITAL_COMMUNITY): Payer: Self-pay | Admitting: Gastroenterology

## 2014-07-22 DIAGNOSIS — R109 Unspecified abdominal pain: Secondary | ICD-10-CM

## 2014-07-29 ENCOUNTER — Encounter (HOSPITAL_COMMUNITY)
Admission: RE | Admit: 2014-07-29 | Discharge: 2014-07-29 | Disposition: A | Discharge status: Home / Self Care | Payer: 59 | Source: Ambulatory Visit | Attending: Gastroenterology | Admitting: Gastroenterology

## 2014-07-29 DIAGNOSIS — R109 Unspecified abdominal pain: Secondary | ICD-10-CM | POA: Diagnosis not present

## 2014-07-29 MED ORDER — SINCALIDE 5 MCG IJ SOLR
INTRAMUSCULAR | Status: AC
  Administered 2014-07-29: 1.9 ug via INTRAVENOUS
  Filled 2014-07-29: qty 5

## 2014-07-29 MED ORDER — SINCALIDE 5 MCG IJ SOLR
0.0200 ug/kg | Freq: Once | INTRAMUSCULAR | Status: AC
  Administered 2014-07-29: 1.9 ug via INTRAVENOUS

## 2014-07-29 MED ORDER — TECHNETIUM TC 99M MEBROFENIN IV KIT
5.0000 | PACK | Freq: Once | INTRAVENOUS | Status: AC | PRN
  Administered 2014-07-29: 5 via INTRAVENOUS

## 2014-07-29 MED ORDER — STERILE WATER FOR INJECTION IJ SOLN
INTRAMUSCULAR | Status: AC
  Administered 2014-07-29: 10 mL
  Filled 2014-07-29: qty 10

## 2014-08-04 ENCOUNTER — Observation Stay (HOSPITAL_COMMUNITY)
Admission: EM | Admit: 2014-08-04 | Discharge: 2014-08-06 | Disposition: A | Discharge status: Home / Self Care | Payer: 59 | Attending: General Surgery | Admitting: General Surgery

## 2014-08-04 ENCOUNTER — Encounter (HOSPITAL_COMMUNITY): Payer: Self-pay | Admitting: Emergency Medicine

## 2014-08-04 ENCOUNTER — Ambulatory Visit (INDEPENDENT_AMBULATORY_CARE_PROVIDER_SITE_OTHER): Payer: 59 | Admitting: General Surgery

## 2014-08-04 ENCOUNTER — Encounter (INDEPENDENT_AMBULATORY_CARE_PROVIDER_SITE_OTHER): Payer: Self-pay | Admitting: General Surgery

## 2014-08-04 VITALS — BP 122/80 | HR 72 | Resp 14 | Ht 68.0 in | Wt 211.8 lb

## 2014-08-04 DIAGNOSIS — K8010 Calculus of gallbladder with chronic cholecystitis without obstruction: Principal | ICD-10-CM | POA: Diagnosis present

## 2014-08-04 DIAGNOSIS — K8020 Calculus of gallbladder without cholecystitis without obstruction: Secondary | ICD-10-CM

## 2014-08-04 DIAGNOSIS — K811 Chronic cholecystitis: Secondary | ICD-10-CM | POA: Diagnosis present

## 2014-08-04 DIAGNOSIS — Z880 Allergy status to penicillin: Secondary | ICD-10-CM | POA: Insufficient documentation

## 2014-08-04 HISTORY — DX: Gastro-esophageal reflux disease without esophagitis: K21.9

## 2014-08-04 HISTORY — DX: Calculus of gallbladder without cholecystitis without obstruction: K80.20

## 2014-08-04 LAB — CBC
HCT: 40 % (ref 39.0–52.0)
Hemoglobin: 13.7 g/dL (ref 13.0–17.0)
MCH: 30.4 pg (ref 26.0–34.0)
MCHC: 34.3 g/dL (ref 30.0–36.0)
MCV: 88.9 fL (ref 78.0–100.0)
Platelets: 176 10*3/uL (ref 150–400)
RBC: 4.5 MIL/uL (ref 4.22–5.81)
RDW: 12.9 % (ref 11.5–15.5)
WBC: 5.8 10*3/uL (ref 4.0–10.5)

## 2014-08-04 LAB — COMPREHENSIVE METABOLIC PANEL
ALBUMIN: 3.7 g/dL (ref 3.5–5.2)
ALT: 59 U/L — ABNORMAL HIGH (ref 0–53)
ANION GAP: 12 (ref 5–15)
AST: 59 U/L — ABNORMAL HIGH (ref 0–37)
Alkaline Phosphatase: 36 U/L — ABNORMAL LOW (ref 39–117)
BUN: 12 mg/dL (ref 6–23)
CO2: 24 mEq/L (ref 19–32)
CREATININE: 0.81 mg/dL (ref 0.50–1.35)
Calcium: 8.6 mg/dL (ref 8.4–10.5)
Chloride: 103 mEq/L (ref 96–112)
GFR calc Af Amer: >90 mL/min (ref >90)
GFR calc non Af Amer: >90 mL/min (ref >90)
Glucose, Bld: 111 mg/dL — ABNORMAL HIGH (ref 70–99)
Potassium: 5.6 mEq/L — ABNORMAL HIGH (ref 3.7–5.3)
Sodium: 139 mEq/L (ref 137–147)
TOTAL PROTEIN: 6.9 g/dL (ref 6.0–8.3)
Total Bilirubin: 0.5 mg/dL (ref 0.3–1.2)

## 2014-08-04 LAB — URINALYSIS, ROUTINE W REFLEX MICROSCOPIC
Bilirubin Urine: NEGATIVE
GLUCOSE, UA: NEGATIVE mg/dL
Hgb urine dipstick: NEGATIVE
Ketones, ur: NEGATIVE mg/dL
LEUKOCYTES UA: NEGATIVE
NITRITE: NEGATIVE
PH: 7.5 (ref 5.0–8.0)
Protein, ur: NEGATIVE mg/dL
SPECIFIC GRAVITY, URINE: 1.027 (ref 1.005–1.030)
Urobilinogen, UA: 1 mg/dL (ref 0.0–1.0)

## 2014-08-04 LAB — TYPE AND SCREEN
ABO/RH(D): A POS
Antibody Screen: NEGATIVE

## 2014-08-04 LAB — SURGICAL PCR SCREEN
MRSA, PCR: NEGATIVE
Staphylococcus aureus: NEGATIVE

## 2014-08-04 LAB — HCG, SERUM, QUALITATIVE: PREG SERUM: NEGATIVE

## 2014-08-04 LAB — ABO/RH: ABO/RH(D): A POS

## 2014-08-04 LAB — LIPASE, BLOOD: LIPASE: 55 U/L (ref 11–59)

## 2014-08-04 MED ORDER — OXYMETAZOLINE HCL 0.05 % NA SOLN
2.0000 | Freq: Once | NASAL | Status: DC
  Filled 2014-08-04: qty 15

## 2014-08-04 MED ORDER — SODIUM CHLORIDE 0.9 % IV SOLN
INTRAVENOUS | Status: DC
  Administered 2014-08-04: 12:00:00 via INTRAVENOUS

## 2014-08-04 MED ORDER — MORPHINE SULFATE 4 MG/ML IJ SOLN
4.0000 mg | INTRAMUSCULAR | Status: DC | PRN
  Administered 2014-08-04: 4 mg via INTRAVENOUS
  Filled 2014-08-04: qty 1

## 2014-08-04 MED ORDER — MORPHINE SULFATE 2 MG/ML IJ SOLN
2.0000 mg | INTRAMUSCULAR | Status: DC | PRN
  Administered 2014-08-05 – 2014-08-06 (×3): 2 mg via INTRAVENOUS
  Filled 2014-08-04 (×3): qty 1

## 2014-08-04 MED ORDER — ENOXAPARIN SODIUM 30 MG/0.3ML ~~LOC~~ SOLN
30.0000 mg | Freq: Two times a day (BID) | SUBCUTANEOUS | Status: DC
  Filled 2014-08-04 (×2): qty 0.3

## 2014-08-04 MED ORDER — CIPROFLOXACIN IN D5W 400 MG/200ML IV SOLN
400.0000 mg | Freq: Two times a day (BID) | INTRAVENOUS | Status: DC
  Administered 2014-08-04 – 2014-08-06 (×5): 400 mg via INTRAVENOUS
  Filled 2014-08-04 (×6): qty 200

## 2014-08-04 MED ORDER — ACETAMINOPHEN 325 MG PO TABS: 650.0000 mg | ORAL_TABLET | Freq: Four times a day (QID) | ORAL | Status: DC | PRN

## 2014-08-04 MED ORDER — CHLORHEXIDINE GLUCONATE 4 % EX LIQD
1.0000 "application " | Freq: Once | CUTANEOUS | Status: AC
  Administered 2014-08-05: 1 via TOPICAL
  Filled 2014-08-04 (×2): qty 15

## 2014-08-04 MED ORDER — ACETAMINOPHEN 650 MG RE SUPP: 650.0000 mg | Freq: Four times a day (QID) | RECTAL | Status: DC | PRN

## 2014-08-04 MED ORDER — CHLORHEXIDINE GLUCONATE 4 % EX LIQD
1.0000 "application " | Freq: Once | CUTANEOUS | Status: AC
  Administered 2014-08-04: 1 via TOPICAL
  Filled 2014-08-04: qty 15

## 2014-08-04 MED ORDER — HYDROCODONE-ACETAMINOPHEN 5-325 MG PO TABS
1.0000 | ORAL_TABLET | ORAL | Status: DC | PRN
  Administered 2014-08-04: 2 via ORAL
  Filled 2014-08-04: qty 2

## 2014-08-04 MED ORDER — DEXTROSE-NACL 5-0.9 % IV SOLN
INTRAVENOUS | Status: DC
  Administered 2014-08-05: via INTRAVENOUS

## 2014-08-04 MED ORDER — PANTOPRAZOLE SODIUM 40 MG PO TBEC
40.0000 mg | DELAYED_RELEASE_TABLET | Freq: Every day | ORAL | Status: DC
  Administered 2014-08-04 – 2014-08-06 (×2): 40 mg via ORAL
  Filled 2014-08-04 (×2): qty 1

## 2014-08-04 MED ORDER — ENOXAPARIN SODIUM 30 MG/0.3ML ~~LOC~~ SOLN
30.0000 mg | Freq: Two times a day (BID) | SUBCUTANEOUS | Status: DC
  Administered 2014-08-05 – 2014-08-06 (×3): 30 mg via SUBCUTANEOUS
  Filled 2014-08-04 (×5): qty 0.3

## 2014-08-04 MED ORDER — LIDOCAINE-PRILOCAINE 2.5-2.5 % EX CREA
1.0000 "application " | TOPICAL_CREAM | Freq: Once | CUTANEOUS | Status: DC
  Filled 2014-08-04: qty 5

## 2014-08-04 MED ORDER — ONDANSETRON HCL 4 MG/2ML IJ SOLN
4.0000 mg | INTRAMUSCULAR | Status: DC | PRN
  Administered 2014-08-04: 4 mg via INTRAVENOUS
  Filled 2014-08-04: qty 2

## 2014-08-04 MED ORDER — CIPROFLOXACIN IN D5W 400 MG/200ML IV SOLN
400.0000 mg | INTRAVENOUS | Status: DC
  Filled 2014-08-04: qty 200

## 2014-08-04 MED ORDER — ONDANSETRON HCL 4 MG/2ML IJ SOLN
4.0000 mg | Freq: Four times a day (QID) | INTRAMUSCULAR | Status: DC | PRN
  Administered 2014-08-04 – 2014-08-06 (×4): 4 mg via INTRAVENOUS
  Filled 2014-08-04 (×5): qty 2

## 2014-08-04 NOTE — ED Provider Notes (Signed)
CSN: [id number]     Arrival date & time 08/04/14  1033 History   First MD Initiated Contact with Patient 08/04/14 1105     Chief Complaint  Patient presents with  . Abdominal Pain      HPI Pt was seen at 1115.  Per pt, c/o gradual onset and persistence of constant upper abd "pain" for the past 3 months, worse over the past several weeks. Has been associated with nausea and intermittent diarrhea/constipatioin. Describes the abd pain as previously intermittent, now constant. Pt has been evaluated by his GI MD and General Surgeon with [place] abd, CT A/P, and HIDA scan, and has been dx with cholelithiasis. Pt was evaluated by his General Surgeon Dr. Carolynne Edouard in the office PTA, with surgery scheduled for 08/14/14. Pt states he "hurts too much to wait" so he came to the ED for evaluation today. Denies vomiting, no fevers, no back pain, no rash, no CP/SOB, no black or blood in stools.       Past Medical History  Diagnosis Date  . Cholelithiasis    Past Surgical History  Procedure Laterality Date  . Vasectomy    . Shoulder surgery    . Hand surgery      carpel tunnel   Family History  Problem Relation Age of Onset  . COPD Mother    History  Substance Use Topics  . Smoking status: Never Smoker   . Smokeless tobacco: Never Used  . Alcohol Use: No    Review of Systems ROS: Statement: All systems negative except as marked or noted in the HPI; Constitutional: Negative for fever and chills. ; ; Eyes: Negative for eye pain, redness and discharge. ; ; ENMT: Negative for ear pain, hoarseness, nasal congestion, sinus pressure and sore throat. ; ; Cardiovascular: Negative for chest pain, palpitations, diaphoresis, dyspnea and peripheral edema. ; ; Respiratory: Negative for cough, wheezing and stridor. ; ; Gastrointestinal: +nausea, diarrhea, abd pain. Negative for vomiting, blood in stool, hematemesis, jaundice and rectal bleeding. . ; ; Genitourinary: Negative for dysuria, flank pain and hematuria. ; ;  Musculoskeletal: Negative for back pain and neck pain. Negative for swelling and trauma.; ; Skin: Negative for pruritus, rash, abrasions, blisters, bruising and skin lesion.; ; Neuro: Negative for headache, lightheadedness and neck stiffness. Negative for weakness, altered level of consciousness , altered mental status, extremity weakness, paresthesias, involuntary movement, seizure and syncope.        Allergies  Penicillins  Home Medications   Prior to Admission medications   Medication Sig Start Date End Date Taking? Authorizing Provider  esomeprazole (NEXIUM) 20 MG capsule Take 20 mg by mouth daily at 12 noon.   Yes Historical Provider, MD  ondansetron (ZOFRAN-ODT) 4 MG disintegrating tablet Take 4 mg by mouth every 8 (eight) hours as needed for nausea or vomiting.   Yes Historical Provider, MD  HYDROcodone-acetaminophen (NORCO/VICODIN) 5-325 MG per tablet Take 1 tablet by mouth every 6 (six) hours as needed. 05/18/14   Marissa Sciacca, PA-C   BP 118/72  Pulse 59  Resp 18  Ht 5\' 8"  (1.727 m)  Wt 211 lb 12 oz (96.049 kg)  BMI 32.20 kg/m2  SpO2 97% Physical Exam 1120; Physical examination:  Nursing notes reviewed; Vital signs and O2 SAT reviewed;  Constitutional: Well developed, Well nourished, Well hydrated, In no acute distress; Head:  Normocephalic, atraumatic; Eyes: EOMI, PERRL, No scleral icterus; ENMT: Mouth and pharynx normal, Mucous membranes moist; Neck: Supple, Full range of motion, No lymphadenopathy; Cardiovascular: Regular  rate and rhythm, No murmur, rub, or gallop; Respiratory: Breath sounds clear & equal bilaterally, No rales, rhonchi, wheezes.  Speaking full sentences with ease, Normal respiratory effort/excursion; Chest: Nontender, Movement normal; Abdomen: Soft, +RUQ and mid-epigastric areas tender to palp. No rebound or guarding. Nondistended, Normal bowel sounds; Genitourinary: No CVA tenderness; Extremities: Pulses normal, No tenderness, No edema, No calf edema or  asymmetry.; Neuro: AA&Ox3, Major CN grossly intact.  Speech clear. No gross focal motor or sensory deficits in extremities.; Skin: Color normal, Warm, Dry.   ED Course  Procedures   1125:  T/C to General Surgery, case discussed, including:  HPI, pertinent PM/SHx, VS/PE, dx testing, ED course and treatment:  Agreeable to come to ED for evaluation.   1300:  Surgery will admit pt.     EKG Interpretation   Date/Time:  Monday August 04 2014 11:43:07 EDT Ventricular Rate:  58 PR Interval:  138 QRS Duration: 88 QT Interval:  425 QTC Calculation: 417 R Axis:   28 Text Interpretation:  Normal sinus rhythm Normal ECG When compared with  ECG of 10/23/2000 No significant change was found Confirmed by [facility]   MD, Nicholos JohnsKATHLEEN [phone]) on 08/04/2014 12:01:46 PM      MDM  MDM Reviewed: previous chart, nursing note and vitals Reviewed previous: ultrasound, labs and CT scan Interpretation: labs    Results for orders placed during the hospital encounter of 08/04/14  URINALYSIS, ROUTINE W REFLEX MICROSCOPIC      Result Value Ref Range   Color, Urine YELLOW  YELLOW   APPearance HAZY (*) CLEAR   Specific Gravity, Urine 1.027  1.005 - 1.030   pH 7.5  5.0 - 8.0   Glucose, UA NEGATIVE  NEGATIVE mg/dL   Hgb urine dipstick NEGATIVE  NEGATIVE   Bilirubin Urine NEGATIVE  NEGATIVE   Ketones, ur NEGATIVE  NEGATIVE mg/dL   Protein, ur NEGATIVE  NEGATIVE mg/dL   Urobilinogen, UA 1.0  0.0 - 1.0 mg/dL   Nitrite NEGATIVE  NEGATIVE   Leukocytes, UA NEGATIVE  NEGATIVE   Nm Hepato W/eject Fract 07/29/2014   CLINICAL DATA:  Abdominal pain and known cholelithiasis  EXAM: NUCLEAR MEDICINE HEPATOBILIARY IMAGING WITH GALLBLADDER EF  Views: Anterior, right lateral right upper quadrant  Radionuclide:  Technetium 8767m Choletec  Dose:  5.0 mCi  Route of administration: Intravenous  COMPARISON:  None.  FINDINGS: Liver uptake of radiotracer is normal. There is prompt visualization of small bowel, indicating patency of  the common bile duct. There is slight delayed gallbladder visualization compared to small bowel visualization. Visualization of the gallbladder is consistent with cystic duct patency.  A weight based dose, 1.9 mcg, of CCK was administered intravenously with calculation of the computer generated ejection fraction of radiotracer from the gallbladder. The patient experienced mild pain with the CCK administration. The computer generated ejection fraction of radiotracer from the gallbladder is diminished at 8.5%, normal greater than 38%.  IMPRESSION: Abnormally low ejection fraction of radiotracer from the gallbladder, a finding indicative of biliary dyskinesia. Gallbladder visualizes indicating patency of the cystic duct. However, gallbladder visualizes approximately a 25 min after small bowel visualization, a finding that could indicate a degree of chronic cholecystitis. Note that the patency of the common bile duct is documented by visualization of small bowel.   Electronically Signed   By: Bretta BangWilliam  Woodruff M.D.   On: 07/29/2014 11:31      Samuel JesterKathleen , DO 08/07/14 1525

## 2014-08-04 NOTE — Patient Instructions (Signed)
Plan for lap chole with ioc 

## 2014-08-04 NOTE — H&P (Signed)
He has chronic cholecystitis and we discussed lap chole with risks/benefits associated with that.

## 2014-08-04 NOTE — H&P (Signed)
Chief Complaint: abdominal pain HPI: Casey Hutchinson is a healthy 48 year old male who presents to [facility] with ongoing abdominal pain.  He was seen by Dr. Carolynne Edouard this morning and scheduled for a laparoscopic cholecystectomy on the 20th, however, his pain is persistent and preventing him from working.  Duration of symptoms is 3 months.  He was initially seen at [facility] and had an Korea which showed cholelithiasis.  He was then referred to surgery, however, followed up by PCP at insurance request.  He was started on Protonix.  He was then referred to Dr. Randa Evens and reportedly had a negative colonoscopy and endoscopy.  He then had a HIDA scan which showed biliary dyskinesia.   He reports his symptoms are constant. Onset was gradual.  Coarse is worsening.  Location is in the epigastric region.  No aggravating or alleviating factors.  Not much worsened after meals.  Associated with nausea, no vomiting.  Modifying factors include Protonix.  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  He has not had any symptoms since.  Denies recent weight loss.  Denies melena or hematochezia.  He does not take any medication, blood thinners.  Last oral intake was at 8AM this morning.    Past Medical History  Diagnosis Date  . Cholelithiasis     Past Surgical History  Procedure Laterality Date  . Vasectomy    . Shoulder surgery    . Hand surgery      carpel tunnel    Family History  Problem Relation Age of Onset  . COPD Mother    Social History:  reports that he has never smoked. He has never used smokeless tobacco. He reports that he does not drink alcohol or use illicit drugs.  Allergies:  Allergies  Allergen Reactions  . Penicillins Rash     (Not in a hospital admission)  Results for orders placed during the hospital encounter of 08/04/14 (from the past 48 hour(s))  URINALYSIS, ROUTINE W REFLEX MICROSCOPIC     Status: Abnormal   Collection Time   08/04/14 11:31 AM      Result Value Ref Range   Color, Urine YELLOW  YELLOW   APPearance HAZY (*) CLEAR   Specific Gravity, Urine 1.027  1.005 - 1.030   pH 7.5  5.0 - 8.0   Glucose, UA NEGATIVE  NEGATIVE mg/dL   Hgb urine dipstick NEGATIVE  NEGATIVE   Bilirubin Urine NEGATIVE  NEGATIVE   Ketones, ur NEGATIVE  NEGATIVE mg/dL   Protein, ur NEGATIVE  NEGATIVE mg/dL   Urobilinogen, UA 1.0  0.0 - 1.0 mg/dL   Nitrite NEGATIVE  NEGATIVE   Leukocytes, UA NEGATIVE  NEGATIVE   Comment: MICROSCOPIC NOT DONE ON URINES WITH NEGATIVE PROTEIN, BLOOD, LEUKOCYTES, NITRITE, OR GLUCOSE <1000 mg/dL.   No results found.  Review of Systems  All other systems reviewed and are negative.   Blood pressure 114/63, pulse 60, resp. rate 13, height 5\' 8"  (1.727 m), weight 211 lb 12 oz (96.049 kg), SpO2 96.00%. Physical Exam  Constitutional: He is oriented to person, place, and time. He appears well-developed and well-nourished. No distress.  HENT:  Head: Normocephalic and atraumatic.  Neck: Normal range of motion. Neck supple.  Cardiovascular: Normal rate, regular rhythm, normal heart sounds and intact distal pulses.  Exam reveals no gallop and no friction rub.   No murmur heard. Respiratory: Effort normal and breath sounds normal. No respiratory distress. He has no wheezes.  He has no rales. He exhibits no tenderness.  GI: Soft. Bowel sounds are normal. He exhibits no distension and no mass. There is no rebound and no guarding.  Minimal TTP to RUQ with deep palpation.  Musculoskeletal: Normal range of motion. He exhibits no edema and no tenderness.  Lymphadenopathy:    He has no cervical adenopathy.  Neurological: He is alert and oriented to person, place, and time.  Skin: Skin is warm and dry. No rash noted. He is not diaphoretic. No erythema. No pallor.  Psychiatric: He has a normal mood and affect. His behavior is normal. Judgment and thought content normal.     Assessment/Plan Symptomatic  cholelithiasis -admit for IV antibiotics, IV hydration, pain control.  Will await his CBC and LFTs. -will proceed with a laparoscopic cholecystectomy with IOC in AM -may have clears -NPO after midnight -SCD/lovenox -anti-emetics  ,  ANP-BC 08/04/2014, 12:38 PM

## 2014-08-04 NOTE — ED Notes (Signed)
Pt c/o abdominal pain since May 24th. Pt was diagnosed with gallbladder problem. Pt went to surgeon office today, next appointment is the 20th. Pt told to come to ED if pain gets worse.

## 2014-08-04 NOTE — ED Notes (Signed)
Pt placed into gown and on monitor upon arrival to room. Pt monitored by blood pressure and pulse ox.  

## 2014-08-04 NOTE — Progress Notes (Signed)
Patient ID: Casey Hutchinson, male   DOB: 09/30/1966, 48 y.o.   MRN: [id number]  No chief complaint on file.   HPI Casey Kannerhomas T Font is a 48 y.o. male.  We're asked to see the patient in consultation by Dr. Randa EvensEdwards to evaluate him for gallstones. The patient is a 48 year old white male who has been having epigastric pain since May. The pain tends to come and go but has been getting more frequent and more severe with time. He now has pain every day. He has significant nausea but no vomiting. He underwent an ultrasound which did show a stone impacted in the gallbladder neck. There was no gallbladder wall thickening or ductal dilatation. His liver functions were normal.  HPI  History reviewed. No pertinent past medical history.  Past Surgical History  Procedure Laterality Date  . Vasectomy    . Shoulder surgery    . Hand surgery      carpel tunnel    Family History  Problem Relation Age of Onset  . COPD Mother     Social History History  Substance Use Topics  . Smoking status: Never Smoker   . Smokeless tobacco: Never Used  . Alcohol Use: No    Allergies  Allergen Reactions  . Penicillins Rash    Current Outpatient Prescriptions  Medication Sig Dispense Refill  . esomeprazole (NEXIUM) 20 MG capsule Take 20 mg by mouth daily at 12 noon.      Marland Kitchen. HYDROcodone-acetaminophen (NORCO/VICODIN) 5-325 MG per tablet Take 1 tablet by mouth every 6 (six) hours as needed.  7 tablet  0  . ondansetron (ZOFRAN) 4 MG tablet Take 1 tablet (4 mg total) by mouth every 6 (six) hours.  12 tablet  0   No current facility-administered medications for this visit.    Review of Systems Review of Systems  Constitutional: Negative.   HENT: Negative.   Eyes: Negative.   Respiratory: Negative.   Cardiovascular: Negative.   Gastrointestinal: Positive for nausea and abdominal pain.  Endocrine: Negative.   Genitourinary: Negative.   Musculoskeletal: Negative.   Skin: Negative.   Allergic/Immunologic:  Negative.   Neurological: Negative.   Hematological: Negative.   Psychiatric/Behavioral: Negative.     Blood pressure 122/80, pulse 72, resp. rate 14, height 5\' 8"  (1.727 m), weight 211 lb 12.8 oz (96.072 kg).  Physical Exam Physical Exam  Constitutional: He is oriented to person, place, and time. He appears well-developed and well-nourished.  HENT:  Head: Normocephalic and atraumatic.  Eyes: Conjunctivae and EOM are normal. Pupils are equal, round, and reactive to light.  Neck: Normal range of motion. Neck supple.  Cardiovascular: Normal rate, regular rhythm and normal heart sounds.   Pulmonary/Chest: Effort normal and breath sounds normal.  Abdominal: Soft. Bowel sounds are normal.  There is mild epigastric and right upper quadrant pain but no guarding. There is no palpable mass.  Musculoskeletal: Normal range of motion.  Neurological: He is alert and oriented to person, place, and time.  Skin: Skin is warm and dry.  Psychiatric: He has a normal mood and affect. His behavior is normal.    Data Reviewed As above  Assessment    The patient appears to have symptomatic gallstones. Because of the risk of further painful episodes and possible pancreatitis I think he would benefit from having his gallbladder removed. He would also like to have this done. I have discussed with him in detail the risks and benefits of the operation to remove the gallbladder as well  as some of the technical aspects and he understands and wishes to proceed     Plan    Plan for laparoscopic cholecystectomy with intraoperative cholangiogram        TOTH III,PAUL S 08/04/2014, 10:20 AM

## 2014-08-04 NOTE — ED Notes (Signed)
Pt remains monitored by 5 lead, blood pressure, and pulse ox.  

## 2014-08-05 ENCOUNTER — Encounter (HOSPITAL_COMMUNITY): Payer: Self-pay | Admitting: Anesthesiology

## 2014-08-05 ENCOUNTER — Encounter (HOSPITAL_COMMUNITY)
Admission: EM | Disposition: A | Discharge status: Home / Self Care | Payer: Self-pay | Source: Home / Self Care | Attending: Emergency Medicine

## 2014-08-05 ENCOUNTER — Observation Stay (HOSPITAL_COMMUNITY): Payer: 59 | Admitting: Anesthesiology

## 2014-08-05 ENCOUNTER — Encounter (HOSPITAL_COMMUNITY): Payer: 59 | Admitting: Anesthesiology

## 2014-08-05 DIAGNOSIS — K8010 Calculus of gallbladder with chronic cholecystitis without obstruction: Secondary | ICD-10-CM

## 2014-08-05 HISTORY — PX: CHOLECYSTECTOMY: SHX55

## 2014-08-05 LAB — BASIC METABOLIC PANEL
Anion gap: 8 (ref 5–15)
BUN: 9 mg/dL (ref 6–23)
CALCIUM: 8.2 mg/dL — AB (ref 8.4–10.5)
CO2: 28 mEq/L (ref 19–32)
Chloride: 104 mEq/L (ref 96–112)
Creatinine, Ser: 0.97 mg/dL (ref 0.50–1.35)
GFR calc Af Amer: >90 mL/min (ref >90)
GFR calc non Af Amer: >90 mL/min (ref >90)
Glucose, Bld: 111 mg/dL — ABNORMAL HIGH (ref 70–99)
POTASSIUM: 4.4 meq/L (ref 3.7–5.3)
SODIUM: 140 meq/L (ref 137–147)

## 2014-08-05 SURGERY — LAPAROSCOPIC CHOLECYSTECTOMY
Anesthesia: General | Site: Abdomen

## 2014-08-05 MED ORDER — OXYCODONE HCL 5 MG PO TABS
ORAL_TABLET | ORAL | Status: AC
  Filled 2014-08-05: qty 1

## 2014-08-05 MED ORDER — 0.9 % SODIUM CHLORIDE (POUR BTL) OPTIME
TOPICAL | Status: DC | PRN
  Administered 2014-08-05: 1000 mL

## 2014-08-05 MED ORDER — FENTANYL CITRATE 0.05 MG/ML IJ SOLN
INTRAMUSCULAR | Status: DC | PRN
  Administered 2014-08-05: 50 ug via INTRAVENOUS
  Administered 2014-08-05 (×2): 100 ug via INTRAVENOUS

## 2014-08-05 MED ORDER — DEXAMETHASONE SODIUM PHOSPHATE 4 MG/ML IJ SOLN
INTRAMUSCULAR | Status: DC | PRN
  Administered 2014-08-05: 8 mg via INTRAVENOUS

## 2014-08-05 MED ORDER — MIDAZOLAM HCL 5 MG/5ML IJ SOLN
INTRAMUSCULAR | Status: DC | PRN
  Administered 2014-08-05: 2 mg via INTRAVENOUS

## 2014-08-05 MED ORDER — ONDANSETRON HCL 4 MG/2ML IJ SOLN
INTRAMUSCULAR | Status: AC
  Filled 2014-08-05: qty 2

## 2014-08-05 MED ORDER — HYDROMORPHONE HCL PF 1 MG/ML IJ SOLN
INTRAMUSCULAR | Status: AC
  Administered 2014-08-05: 0.5 mg via INTRAVENOUS
  Filled 2014-08-05: qty 1

## 2014-08-05 MED ORDER — LACTATED RINGERS IV SOLN
INTRAVENOUS | Status: DC | PRN
  Administered 2014-08-05: 11:00:00 via INTRAVENOUS
  Administered 2014-08-05: 1000 mL via INTRAVENOUS

## 2014-08-05 MED ORDER — PROMETHAZINE HCL 25 MG/ML IJ SOLN
6.2500 mg | INTRAMUSCULAR | Status: DC | PRN
  Administered 2014-08-05: 6.25 mg via INTRAVENOUS

## 2014-08-05 MED ORDER — HYDROMORPHONE HCL PF 1 MG/ML IJ SOLN
0.2500 mg | INTRAMUSCULAR | Status: DC | PRN
  Administered 2014-08-05 (×3): 0.5 mg via INTRAVENOUS

## 2014-08-05 MED ORDER — FENTANYL CITRATE 0.05 MG/ML IJ SOLN
INTRAMUSCULAR | Status: AC
  Filled 2014-08-05: qty 5

## 2014-08-05 MED ORDER — BUPIVACAINE-EPINEPHRINE 0.25% -1:200000 IJ SOLN
INTRAMUSCULAR | Status: DC | PRN
  Administered 2014-08-05: 13 mL

## 2014-08-05 MED ORDER — BUPIVACAINE-EPINEPHRINE (PF) 0.25% -1:200000 IJ SOLN
INTRAMUSCULAR | Status: AC
  Filled 2014-08-05: qty 30

## 2014-08-05 MED ORDER — PROMETHAZINE HCL 25 MG/ML IJ SOLN
INTRAMUSCULAR | Status: AC
  Filled 2014-08-05: qty 1

## 2014-08-05 MED ORDER — GLYCOPYRROLATE 0.2 MG/ML IJ SOLN
INTRAMUSCULAR | Status: DC | PRN
  Administered 2014-08-05: 0.6 mg via INTRAVENOUS

## 2014-08-05 MED ORDER — ROCURONIUM BROMIDE 100 MG/10ML IV SOLN
INTRAVENOUS | Status: DC | PRN
  Administered 2014-08-05: 50 mg via INTRAVENOUS

## 2014-08-05 MED ORDER — NEOSTIGMINE METHYLSULFATE 10 MG/10ML IV SOLN
INTRAVENOUS | Status: AC
  Filled 2014-08-05: qty 1

## 2014-08-05 MED ORDER — SODIUM CHLORIDE 0.9 % IR SOLN
Status: DC | PRN
  Administered 2014-08-05: 1

## 2014-08-05 MED ORDER — ONDANSETRON HCL 4 MG/2ML IJ SOLN
INTRAMUSCULAR | Status: DC | PRN
Start: 2014-08-05 — End: 2014-08-05
  Administered 2014-08-05: 4 mg via INTRAVENOUS

## 2014-08-05 MED ORDER — NEOSTIGMINE METHYLSULFATE 10 MG/10ML IV SOLN
INTRAVENOUS | Status: DC | PRN
  Administered 2014-08-05: 5 mg via INTRAVENOUS

## 2014-08-05 MED ORDER — LIDOCAINE HCL (CARDIAC) 20 MG/ML IV SOLN
INTRAVENOUS | Status: DC | PRN
Start: 2014-08-05 — End: 2014-08-05
  Administered 2014-08-05: 100 mg via INTRAVENOUS

## 2014-08-05 MED ORDER — OXYCODONE HCL 5 MG PO TABS
5.0000 mg | ORAL_TABLET | Freq: Once | ORAL | Status: AC | PRN
  Administered 2014-08-05: 5 mg via ORAL

## 2014-08-05 MED ORDER — MIDAZOLAM HCL 2 MG/2ML IJ SOLN
INTRAMUSCULAR | Status: AC
  Filled 2014-08-05: qty 2

## 2014-08-05 MED ORDER — DEXTROSE-NACL 5-0.9 % IV SOLN
INTRAVENOUS | Status: DC
  Administered 2014-08-06: 04:00:00 via INTRAVENOUS

## 2014-08-05 MED ORDER — OXYCODONE HCL 5 MG/5ML PO SOLN: 5.0000 mg | Freq: Once | ORAL | Status: AC | PRN

## 2014-08-05 MED ORDER — HYDROMORPHONE HCL PF 1 MG/ML IJ SOLN
INTRAMUSCULAR | Status: AC
  Filled 2014-08-05: qty 1

## 2014-08-05 MED ORDER — PROPOFOL 10 MG/ML IV BOLUS
INTRAVENOUS | Status: DC | PRN
  Administered 2014-08-05: 200 mg via INTRAVENOUS

## 2014-08-05 MED ORDER — GLYCOPYRROLATE 0.2 MG/ML IJ SOLN
INTRAMUSCULAR | Status: AC
  Filled 2014-08-05: qty 3

## 2014-08-05 SURGICAL SUPPLY — 37 items
ADH SKN CLS APL DERMABOND .7 (GAUZE/BANDAGES/DRESSINGS) ×2
APPLIER CLIP 5 13 M/L LIGAMAX5 (MISCELLANEOUS) ×3
APR CLP MED LRG 5 ANG JAW (MISCELLANEOUS) ×2
BAG SPEC RTRVL 10 TROC 200 (ENDOMECHANICALS) ×2
BLADE SURG ROTATE 9660 (MISCELLANEOUS) IMPLANT
CANISTER SUCTION 2500CC (MISCELLANEOUS) ×3 IMPLANT
CHLORAPREP W/TINT 26ML (MISCELLANEOUS) ×3 IMPLANT
CLIP APPLIE 5 13 M/L LIGAMAX5 (MISCELLANEOUS) ×2 IMPLANT
COVER MAYO STAND STRL (DRAPES) ×3 IMPLANT
COVER SURGICAL LIGHT HANDLE (MISCELLANEOUS) ×3 IMPLANT
DERMABOND ADVANCED (GAUZE/BANDAGES/DRESSINGS) ×1
DERMABOND ADVANCED .7 DNX12 (GAUZE/BANDAGES/DRESSINGS) ×2 IMPLANT
DRAPE C-ARM 42X72 X-RAY (DRAPES) ×3 IMPLANT
ELECT REM PT RETURN 9FT ADLT (ELECTROSURGICAL) ×3
ELECTRODE REM PT RTRN 9FT ADLT (ELECTROSURGICAL) ×2 IMPLANT
GLOVE BIO SURGEON STRL SZ7 (GLOVE) ×3 IMPLANT
GLOVE BIOGEL PI IND STRL 7.5 (GLOVE) ×2 IMPLANT
GLOVE BIOGEL PI INDICATOR 7.5 (GLOVE) ×1
GOWN STRL REUS W/ TWL LRG LVL3 (GOWN DISPOSABLE) ×8 IMPLANT
GOWN STRL REUS W/TWL LRG LVL3 (GOWN DISPOSABLE) ×12
KIT BASIN OR (CUSTOM PROCEDURE TRAY) ×3 IMPLANT
KIT ROOM TURNOVER OR (KITS) ×3 IMPLANT
NS IRRIG 1000ML POUR BTL (IV SOLUTION) ×3 IMPLANT
PAD ARMBOARD 7.5X6 YLW CONV (MISCELLANEOUS) ×3 IMPLANT
POUCH RETRIEVAL ECOSAC 10 (ENDOMECHANICALS) ×2 IMPLANT
POUCH RETRIEVAL ECOSAC 10MM (ENDOMECHANICALS) ×1
SCISSORS LAP 5X35 DISP (ENDOMECHANICALS) ×3 IMPLANT
SET CHOLANGIOGRAPH 5 50 .035 (SET/KITS/TRAYS/PACK) ×3 IMPLANT
SET IRRIG TUBING LAPAROSCOPIC (IRRIGATION / IRRIGATOR) ×3 IMPLANT
SLEEVE ENDOPATH XCEL 5M (ENDOMECHANICALS) ×6 IMPLANT
SPECIMEN JAR SMALL (MISCELLANEOUS) ×3 IMPLANT
SUT MNCRL AB 4-0 PS2 18 (SUTURE) ×3 IMPLANT
TOWEL OR 17X24 6PK STRL BLUE (TOWEL DISPOSABLE) ×3 IMPLANT
TOWEL OR 17X26 10 PK STRL BLUE (TOWEL DISPOSABLE) ×3 IMPLANT
TRAY LAPAROSCOPIC (CUSTOM PROCEDURE TRAY) ×3 IMPLANT
TROCAR XCEL BLUNT TIP 100MML (ENDOMECHANICALS) ×3 IMPLANT
TROCAR XCEL NON-BLD 5MMX100MML (ENDOMECHANICALS) ×3 IMPLANT

## 2014-08-05 NOTE — Anesthesia Postprocedure Evaluation (Signed)
  Anesthesia Post-op Note  Patient: Casey Hutchinson  Procedure(s) Performed: Procedure(s): LAPAROSCOPIC CHOLECYSTECTOMY (N/A)  Patient Location: PACU  Anesthesia Type:General  Level of Consciousness: awake and alert   Airway and Oxygen Therapy: Patient Spontanous Breathing  Post-op Pain: mild  Post-op Assessment: Post-op Vital signs reviewed  Post-op Vital Signs: stable  Last Vitals:  Filed Vitals:   08/05/14 1431  BP:   Pulse:   Temp: 36.7 C  Resp:     Complications: No apparent anesthesia complications

## 2014-08-05 NOTE — Op Note (Signed)
Preoperative diagnosis: Chronic cholecystitis Postoperative diagnosis: Same as above Procedure: Laparoscopic cholecystectomy Surgeon: Dr Harden MoMatt  Asst: Aris GeorgiaMegan Dort Estimated blood loss: Minimal Complications: None Drains: None Specimens: Gallbladder to pathology Sponge count was correct at completion Disposition to recovery stable  Indications: This is a 48 year old male who presents with about 3 months of right upper quadrant pain. He had an ultrasound in May that appears to be consistent with gallbladder disease. He's continued to have pain. He was seen in our office and came over the emergency room immediately thereafter. I saw him and we discussed proceeding with a laparoscopic cholecystectomy.  Procedure: After informed consent was obtained the patient was taken to the operating room. He was on antibiotics on the floor. Sequential compression devices were on his legs. He was placed under general anesthesia without complication. Here his abdomen was prepped and draped in the standard sterile surgical fashion. A surgical timeout was then performed.  I infiltrated Marcaine below his umbilicus. I made a vertical incision. I grasped his fascia with a Kocher clamp. I entered this sharply. Then injected his peritoneum sharply. There was no evidence of an entry injury. I then placed a 0 Vicryl pursestring suture to the fascia. I then inserted a Hassan trocar and insufflated the abdomen to 15 mm mercury pressure. I then placed 3 further 5 mm trocars in the epigastrium and right upper quadrant under direct vision without difficulty. The gallbladder was then retracted cephalad. There were some omental attachments that were taken down bluntly. I then retracted the gallbladder cephalad and lateral. The critical view of safety was obtained. I clipped the duct 3 times. The duct was viable and the clips completely traversed the duct. I then divided this. I treated the artery in a similar fashion. The  gallbladder was then removed from the liver bed. There was an entrance of the gallbladder and bile spilled but no stones. I then was able to remove the gallbladder from the liver and placed in a bag. This was removed from the umbilicus. He was was then obtained. Irrigation was performed until this was clear. The clips all looked to be in good position. I then removed by [facility] tro[facility] and tied my pursestring. This completely obliterated the defect. I then desufflated the abdomen and remove our remaining trocars. The incisions were closed 4-0 Monocryl and Dermabond. He tolerated this well was extubated and transferred to the recovery room in stable condition.

## 2014-08-05 NOTE — Progress Notes (Signed)
UR Completed.  ,  Jane [phone] 08/05/2014  

## 2014-08-05 NOTE — Progress Notes (Signed)
Day of Surgery  Subjective: unchanged  Objective: Vital signs in last 24 hours: Temp:  [97.5 F (36.4 C)-97.9 F (36.6 C)] 97.9 F (36.6 C) (08/11 0637) Pulse Rate:  [47-72] 47 (08/11 0637) Resp:  [12-18] 16 (08/11 0637) BP: (105-126)/(63-83) 111/75 mmHg (08/11 0637) SpO2:  [95 %-99 %] 99 % (08/11 0637) Weight:  [211 lb 12 oz (96.049 kg)-211 lb 12.8 oz (96.072 kg)] 211 lb 12 oz (96.049 kg) (08/10 1057) Last BM Date: 08/03/14  Intake/Output from previous day: 08/10 0701 - 08/11 0700 In: 960 [P.O.:960] Out: 800 [Urine:800] Intake/Output this shift:    General appearance: no distress GI: ruq pain  Lab Results:   Recent Labs  08/04/14 1530  WBC 5.8  HGB 13.7  HCT 40.0  PLT 176   BMET  Recent Labs  08/04/14 1150 08/05/14 0357  NA 139 140  K 5.6* 4.4  CL 103 104  CO2 24 28  GLUCOSE 111* 111*  BUN 12 9  CREATININE 0.81 0.97  CALCIUM 8.6 8.2*   PT/INR No results found for this basename: LABPROT, INR,  in the last 72 hours ABG No results found for this basename: PHART, PCO2, PO2, HCO3,  in the last 72 hours  Studies/Results: No results found.  Anti-infectives: Anti-infectives   Start     Dose/Rate Route Frequency Ordered Stop   08/05/14 0600  ciprofloxacin (CIPRO) IVPB 400 mg     400 mg 200 mL/hr over 60 Minutes Intravenous On call to O.R. 08/04/14 1339 08/06/14 0559   08/04/14 1300  ciprofloxacin (CIPRO) IVPB 400 mg     400 mg 200 mL/hr over 60 Minutes Intravenous Every 12 hours 08/04/14 1255        Assessment/Plan: Chronic cholecystitis  Plan lap chole today  , 08/05/2014

## 2014-08-05 NOTE — Anesthesia Preprocedure Evaluation (Signed)
Anesthesia Evaluation  Patient identified by MRN, date of birth, ID band Patient awake    Reviewed: Allergy & Precautions, H&P , NPO status , Patient's Chart, lab work & pertinent test results  History of Anesthesia Complications (+) PONV and history of anesthetic complications  Airway Mallampati: I  Neck ROM: Full    Dental  (+) Teeth Intact   Pulmonary neg pulmonary ROS,  breath sounds clear to auscultation        Cardiovascular negative cardio ROS  Rhythm:Regular Rate:Normal     Neuro/Psych negative neurological ROS     GI/Hepatic Neg liver ROS, GERD-  ,  Endo/Other  negative endocrine ROS  Renal/GU negative Renal ROS     Musculoskeletal   Abdominal (+) + obese,   Peds  Hematology negative hematology ROS (+)   Anesthesia Other Findings   Reproductive/Obstetrics                           Anesthesia Physical Anesthesia Plan  ASA: I  Anesthesia Plan: General   Post-op Pain Management:    Induction: Intravenous  Airway Management Planned: Oral ETT  Additional Equipment:   Intra-op Plan:   Post-operative Plan: Extubation in OR  Informed Consent: I have reviewed the patients History and Physical, chart, labs and discussed the procedure including the risks, benefits and alternatives for the proposed anesthesia with the patient or authorized representative who has indicated his/her understanding and acceptance.   Dental advisory given  Plan Discussed with: CRNA and Surgeon  Anesthesia Plan Comments:         Anesthesia Quick Evaluation

## 2014-08-05 NOTE — Transfer of Care (Signed)
Immediate Anesthesia Transfer of Care Note  Patient: Casey Hutchinson  Procedure(s) Performed: Procedure(s): LAPAROSCOPIC CHOLECYSTECTOMY (N/A)  Patient Location: PACU  Anesthesia Type:General  Level of Consciousness: awake, alert  and oriented  Airway & Oxygen Therapy: Patient Spontanous Breathing and Patient connected to face mask oxygen  Post-op Assessment: Report given to PACU RN and Post -op Vital signs reviewed and stable  Post vital signs: Reviewed and stable  Complications: No apparent anesthesia complications

## 2014-08-06 ENCOUNTER — Encounter (HOSPITAL_COMMUNITY): Payer: Self-pay | Admitting: General Surgery

## 2014-08-06 DIAGNOSIS — K8010 Calculus of gallbladder with chronic cholecystitis without obstruction: Secondary | ICD-10-CM | POA: Diagnosis present

## 2014-08-06 MED ORDER — BISACODYL 10 MG RE SUPP: 10.0000 mg | Freq: Every day | RECTAL | Status: DC | PRN

## 2014-08-06 MED ORDER — BISACODYL 10 MG RE SUPP: 10.0000 mg | Freq: Once | RECTAL | Status: DC

## 2014-08-06 MED ORDER — HYDROCODONE-ACETAMINOPHEN 5-325 MG PO TABS: 1.0000 | ORAL_TABLET | Freq: Four times a day (QID) | ORAL | Status: DC | PRN

## 2014-08-06 MED ORDER — DOCUSATE SODIUM 100 MG PO CAPS
100.0000 mg | ORAL_CAPSULE | Freq: Two times a day (BID) | ORAL | Status: DC
  Administered 2014-08-06: 100 mg via ORAL
  Filled 2014-08-06: qty 1

## 2014-08-06 MED ORDER — OXYCODONE-ACETAMINOPHEN 5-325 MG PO TABS: 1.0000 | ORAL_TABLET | Freq: Four times a day (QID) | ORAL | Status: DC | PRN

## 2014-08-06 MED ORDER — ONDANSETRON 4 MG PO TBDP: 4.0000 mg | ORAL_TABLET | Freq: Three times a day (TID) | ORAL | Status: DC | PRN

## 2014-08-06 MED ORDER — DSS 100 MG PO CAPS: 100.0000 mg | ORAL_CAPSULE | Freq: Two times a day (BID) | ORAL | Status: DC | PRN

## 2014-08-06 MED ORDER — OXYCODONE-ACETAMINOPHEN 5-325 MG PO TABS
1.0000 | ORAL_TABLET | ORAL | Status: DC | PRN
  Administered 2014-08-06: 1 via ORAL
  Filled 2014-08-06: qty 1

## 2014-08-06 NOTE — Progress Notes (Signed)
Discharge instructions gone over with patient. Prescriptions given. Home medications gone over. Follow up appointment is made. Diet, activity, and reasons to call the doctor gone over. Incisional care gone over. My chart discussed. Patient verbalized understanding of instructions.

## 2014-08-06 NOTE — Progress Notes (Signed)
[facility] Progress Note  1 Day Post-Op  Subjective: Pt was very nauseated yesterday in PACU was there until 6pm.  Still a bit nauseated this am.  Has O2 on because he was feeling SOB when he starts coughing.  Using IS, but makes him cough.    Objective: Vital signs in last 24 hours: Temp:  [97.1 F (36.2 C)-99.2 F (37.3 C)] 99.2 F (37.3 C) (08/12 0537) Pulse Rate:  [54-82] 82 (08/12 0537) Resp:  [11-27] 16 (08/12 0537) BP: (92-132)/(53-81) 105/53 mmHg (08/12 0537) SpO2:  [89 %-100 %] 94 % (08/12 0537) Last BM Date: 08/03/14  Intake/Output from previous day: 08/11 0701 - 08/12 0700 In: 1640 [P.O.:240; I.V.:1400] Out: 1485 [Urine:1485] Intake/Output this shift: Total I/O In: 120 [P.O.:120] Out: -   PE: Gen:  Alert, NAD, pleasant Pulm:  IS at [phone] Abd: Soft, mild distension, mild tenderness, +BS, no HSM, incisions C/D/I    Lab Results:   Recent Labs  08/04/14 1530  WBC 5.8  HGB 13.7  HCT 40.0  PLT 176   BMET  Recent Labs  08/04/14 1150 08/05/14 0357  NA 139 140  K 5.6* 4.4  CL 103 104  CO2 24 28  GLUCOSE 111* 111*  BUN 12 9  CREATININE 0.81 0.97  CALCIUM 8.6 8.2*   PT/INR No results found for this basename: LABPROT, INR,  in the last 72 hours CMP     Component Value Date/Time   NA 140 08/05/2014 0357   K 4.4 08/05/2014 0357   CL 104 08/05/2014 0357   CO2 28 08/05/2014 0357   GLUCOSE 111* 08/05/2014 0357   BUN 9 08/05/2014 0357   CREATININE 0.97 08/05/2014 0357   CALCIUM 8.2* 08/05/2014 0357   PROT 6.9 08/04/2014 1150   ALBUMIN 3.7 08/04/2014 1150   AST 59* 08/04/2014 1150   ALT 59* 08/04/2014 1150   ALKPHOS 36* 08/04/2014 1150   BILITOT 0.5 08/04/2014 1150   GFRNONAA >90 08/05/2014 0357   GFRAA >90 08/05/2014 0357   Lipase     Component Value Date/Time   LIPASE 55 08/04/2014 1150       Studies/Results: No results found.  Anti-infectives: Anti-infectives   Start     Dose/Rate Route Frequency Ordered Stop   08/05/14 0600   ciprofloxacin (CIPRO) IVPB 400 mg  Status:  Discontinued     400 mg 200 mL/hr over 60 Minutes Intravenous On call to O.R. 08/04/14 1339 08/05/14 1438   08/04/14 1300  ciprofloxacin (CIPRO) IVPB 400 mg     400 mg 200 mL/hr over 60 Minutes Intravenous Every 12 hours 08/04/14 1255         Assessment/Plan Symptomatic cholelithiasis Chronic cholecystitis POD #1 s/p lap chole Post-op ileus Nausea  Plan: 1.  Advance diet, IVF, pain control, antiemetics 2.  Ambulate and IS 3.  SCD's and lovenox 4.  Dulcolax and colace 5.  Possibly d/c this afternoon if improved 6.  Switch to percocet because norco makes him sick    LOS: 2 days    DORT,  08/06/2014, 9:02 AM Pager: [phone]

## 2014-08-06 NOTE — Discharge Summary (Signed)
[facility] Discharge Summary   Patient ID: Casey Hutchinson MRN: [id number] DOB/AGE: [birth year] 48 y.o.  Admit date: 08/04/2014 Discharge date: 08/06/2014  Admitting Diagnosis: Symptomatic cholelithiasis  Discharge Diagnosis Patient Active Problem List   Diagnosis Date Noted  . Chronic cholecystitis with calculus 08/06/2014  . Gallstones 08/04/2014  . Symptomatic cholelithiasis 08/04/2014    Consultants None  Imaging: No results found.  Procedures Dr. Dwain Sarna (08/05/14) - Laparoscopic Cholecystectomy with [facility]  Hospital Course:  hHealthy 48 year old male who presents to [facility] with ongoing abdominal pain. He was seen by Dr. Carolynne Edouard this morning and scheduled for a laparoscopic cholecystectomy on the 20th, however, his pain is persistent and preventing him from working. Duration of symptoms is 3 months. He was initially seen at [facility] and had an Korea which showed cholelithiasis. He was then referred to surgery, however, followed up by PCP at insurance request. He was started on Protonix. He was then referred to Dr. Randa Evens and reportedly had a negative colonoscopy and endoscopy. He then had a HIDA scan which showed biliary dyskinesia.   He reports his symptoms are constant. Onset was gradual. Coarse is worsening. Location is in the epigastric region. No aggravating or alleviating factors. Not much worsened after meals. Associated with nausea, no vomiting. Modifying factors include Protonix. 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 He has not had any symptoms since. Denies recent weight loss. Denies melena or hematochezia. He does not take any medication, blood thinners. Last oral intake was at 8AM this morning.   Workup ultimately was consistent with symptomatic cholelithiasis along with evidence of chronic cholecystitis.  Patient was admitted and underwent procedure listed above on HD #2.  Tolerated procedure well,  but required longer than normal PACU time due to significant nausea and mild hypoxia.  He was stabilized and transferred to the floor.  Diet was advanced as tolerated.  Nausea and SOB improved on HD #3.  On POD #1/HD #3, the patient was voiding well, tolerating diet, ambulating well, pain well controlled, vital signs stable, incisions c/d/i and felt stable for discharge home.  Patient will follow up in our office in 4 weeks and knows to call with questions or concerns.     Medication List    STOP taking these medications       HYDROcodone-acetaminophen 5-325 MG per tablet  Commonly known as:  NORCO/VICODIN      TAKE these medications       bisacodyl 10 MG suppository  Commonly known as:  DULCOLAX  Place 1 suppository (10 mg total) rectally daily as needed for moderate constipation.     DSS 100 MG Caps  Take 100 mg by mouth 2 (two) times daily as needed for mild constipation.     esomeprazole 20 MG capsule  Commonly known as:  NEXIUM  Take 20 mg by mouth daily at 12 noon.     ondansetron 4 MG disintegrating tablet  Commonly known as:  ZOFRAN-ODT  Take 4 mg by mouth every 8 (eight) hours as needed for nausea or vomiting.     oxyCODONE-acetaminophen 5-325 MG per tablet  Commonly known as:  PERCOCET/ROXICET  Take 1-2 tablets by mouth every 6 (six) hours as needed for severe pain.         Follow-up Information   Follow up with [facility] On 09/02/2014. (For post-operation check. Your appointment is at 2:00pm, please arrive at least 30 min  before your appointment to complete your check in paperwork.  If you are unable to arrive 30 min prior to your appointment time we may have to cancel or reschedule you)    Contact information:   [address]   [place] [id number] [phone]       Signed: Candiss NorseMegan Dort, PA-C [facility] [phone]  08/06/2014, 12:45 PM

## 2014-08-06 NOTE — Progress Notes (Signed)
UR Completed  Graves-Bigelow, RN,BSN [phone]  

## 2014-08-06 NOTE — Discharge Instructions (Signed)
Your appointment is at 2:00pm, please arrive at least 30 min before your appointment to complete your check in paperwork.  If you are unable to arrive 30 min prior to your appointment time we may have to cancel or reschedule you. ° °LAPAROSCOPIC SURGERY: POST OP INSTRUCTIONS  °1. DIET: Follow a light bland diet the first 24 hours after arrival home, such as soup, liquids, crackers, etc. Be sure to include lots of fluids daily. Avoid fast food or heavy meals as your are more likely to get nauseated. Eat a low fat the next few days after surgery.  °2. Take your usually prescribed home medications unless otherwise directed. °3. PAIN CONTROL:  °a. Pain is best controlled by a usual combination of three different methods TOGETHER:  °i. Ice/Heat °ii. Over the counter pain medication °iii. Prescription pain medication °b. Most patients will experience some swelling and bruising around the incisions. Ice packs or heating pads (30-60 minutes up to 6 times a day) will help. Use ice for the first few days to help decrease swelling and bruising, then switch to heat to help relax tight/sore spots and speed recovery. Some people prefer to use ice alone, heat alone, alternating between ice & heat. Experiment to what works for you. Swelling and bruising can take several weeks to resolve.  °c. It is helpful to take an over-the-counter pain medication regularly for the first few weeks. Choose one of the following that works best for you:  °i. Naproxen (Aleve, etc) Two 220mg tabs twice a day °ii. Ibuprofen (Advil, etc) Three 200mg tabs four times a day (every meal & bedtime) °iii. Acetaminophen (Tylenol, etc) 500-650mg four times a day (every meal & bedtime) °d. A prescription for pain medication (such as oxycodone, hydrocodone, etc) should be given to you upon discharge. Take your pain medication as prescribed.  °i. If you are having problems/concerns with the prescription medicine (does not control pain, nausea, vomiting, rash,  itching, etc), please call us [phone] to see if we need to switch you to a different pain medicine that will work better for you and/or control your side effect better. °ii. If you need a refill on your pain medication, please contact your pharmacy. They will contact our office to request authorization. Prescriptions will not be filled after 5 pm or on week-ends. °4. Avoid getting constipated. Between the surgery and the pain medications, it is common to experience some constipation. Increasing fluid intake and taking a fiber supplement (such as Metamucil, Citrucel, FiberCon, MiraLax, etc) 1-2 times a day regularly will usually help prevent this problem from occurring. A mild laxative (prune juice, Milk of Magnesia, MiraLax, etc) should be taken according to package directions if there are no bowel movements after 48 hours.  °5. Watch out for diarrhea. If you have many loose bowel movements, simplify your diet to bland foods & liquids for a few days. Stop any stool softeners and decrease your fiber supplement. Switching to mild anti-diarrheal medications (Kayopectate, Pepto Bismol) can help. If this worsens or does not improve, please call us. °6. Wash / shower every day. You may shower over the dressings as they are waterproof. Continue to shower over incision(s) after the dressing is off. °7. Remove your waterproof bandages 5 days after surgery. You may leave the incision open to air. You may replace a dressing/Band-Aid to cover the incision for comfort if you wish.  °8. ACTIVITIES as tolerated:  °a. You may resume regular (light) daily activities beginning the next day--such as   daily self-care, walking, climbing stairs--gradually increasing activities as tolerated. If you can walk 30 minutes without difficulty, it is safe to try more intense activity such as jogging, treadmill, bicycling, low-impact aerobics, swimming, etc. °b. Save the most intensive and strenuous activity for last such as sit-ups, heavy  lifting, contact sports, etc Refrain from any heavy lifting or straining until you are off narcotics for pain control.  °c. DO NOT PUSH THROUGH PAIN. Let pain be your guide: If it hurts to do something, don't do it. Pain is your body warning you to avoid that activity for another week until the pain goes down. °d. You may drive when you are no longer taking prescription pain medication, you can comfortably wear a seatbelt, and you can safely maneuver your car and apply brakes. °e. You may have sexual intercourse when it is comfortable.  °9. FOLLOW UP in our office  °a. Please call [facility] at [phone] to set up an appointment to see your surgeon in the office for a follow-up appointment approximately 2-3 weeks after your surgery. °b. Make sure that you call for this appointment the day you arrive home to insure a convenient appointment time. °     10. IF YOU HAVE DISABILITY OR FAMILY LEAVE FORMS, BRING THEM TO THE               OFFICE FOR PROCESSING.  ° °WHEN TO CALL US [phone]:  °1. Poor pain control °2. Reactions / problems with new medications (rash/itching, nausea, etc)  °3. Fever over 101.5 F (38.5 C) °4. Inability to urinate °5. Nausea and/or vomiting °6. Worsening swelling or bruising °7. Continued bleeding from incision. °8. Increased pain, redness, or drainage from the incision ° °The clinic staff is available to answer your questions during regular business hours (8:30am-5pm). Please don’t hesitate to call and ask to speak to one of our nurses for clinical concerns.  °If you have a medical emergency, go to the nearest emergency room or call 911.  °A surgeon from [facility] is always on call at the hospitals  ° °[facility]  °[address], [address], [place], [place] [address] ?  °MAIN: [phone] ? TOLL FREE: [phone] ?  °FAX [phone]  °[email or web address] ° °

## 2014-08-06 NOTE — Progress Notes (Signed)
Agree with above maybe home later

## 2014-08-15 ENCOUNTER — Ambulatory Visit (INDEPENDENT_AMBULATORY_CARE_PROVIDER_SITE_OTHER): Payer: 59 | Admitting: Surgery

## 2014-09-02 ENCOUNTER — Encounter (INDEPENDENT_AMBULATORY_CARE_PROVIDER_SITE_OTHER): Payer: 59

## 2014-09-26 ENCOUNTER — Other Ambulatory Visit: Payer: Self-pay | Admitting: Family Medicine

## 2014-09-26 ENCOUNTER — Ambulatory Visit
Admission: RE | Admit: 2014-09-26 | Discharge: 2014-09-26 | Disposition: A | Discharge status: Home / Self Care | Payer: 59 | Source: Ambulatory Visit | Attending: Family Medicine | Admitting: Family Medicine

## 2014-09-26 DIAGNOSIS — R200 Anesthesia of skin: Secondary | ICD-10-CM

## 2014-09-26 DIAGNOSIS — M7918 Myalgia, other site: Secondary | ICD-10-CM

## 2014-09-30 ENCOUNTER — Other Ambulatory Visit: Payer: Self-pay | Admitting: Family Medicine

## 2014-09-30 DIAGNOSIS — M79604 Pain in right leg: Secondary | ICD-10-CM

## 2014-10-07 ENCOUNTER — Ambulatory Visit
Admission: RE | Admit: 2014-10-07 | Discharge: 2014-10-07 | Disposition: A | Discharge status: Home / Self Care | Payer: 59 | Source: Ambulatory Visit | Attending: Family Medicine | Admitting: Family Medicine

## 2014-10-07 DIAGNOSIS — M79604 Pain in right leg: Secondary | ICD-10-CM

## 2014-10-10 ENCOUNTER — Other Ambulatory Visit: Payer: Self-pay

## 2015-06-20 IMAGING — US US ABDOMEN COMPLETE
1 series · 13 of 25 positions shown · non-contrast
Comparison: CT abdomen earlier today.

CLINICAL DATA: Right upper quadrant pain.  Nausea and vomiting.

EXAM:
ULTRASOUND ABDOMEN COMPLETE

[Series 1: us abdomen complete · 0.32mm/px · 13 of 63 slices shown]
[im 1/63]
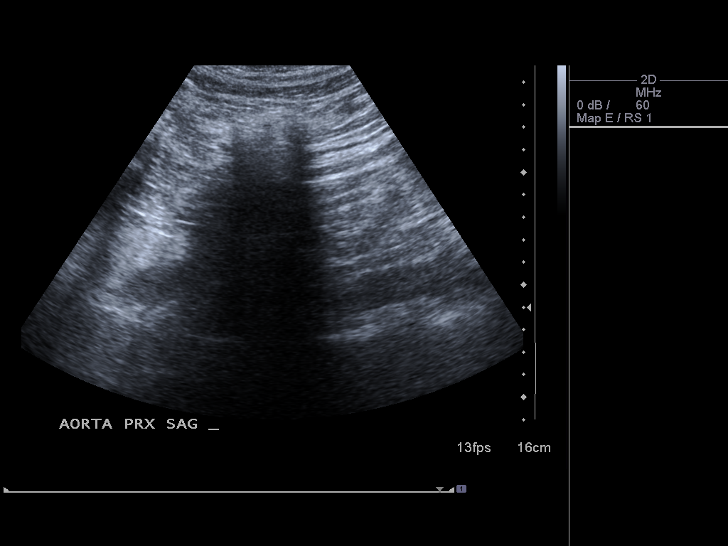
[im 6/63]
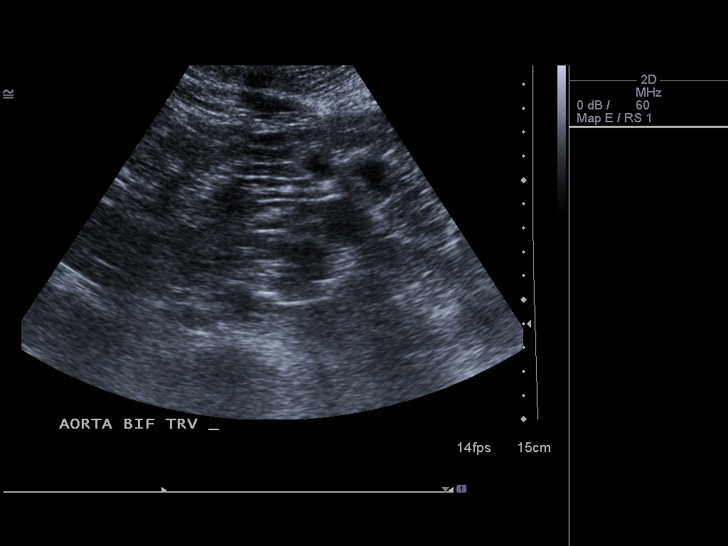
[im 11/63]
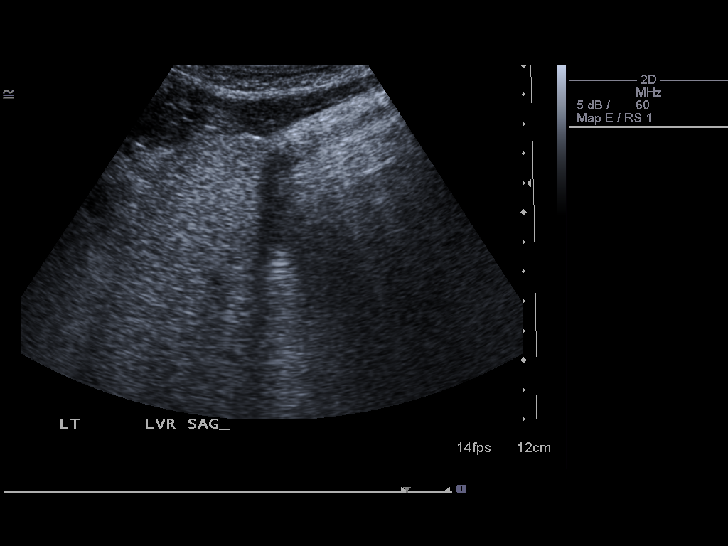
[im 16/63]
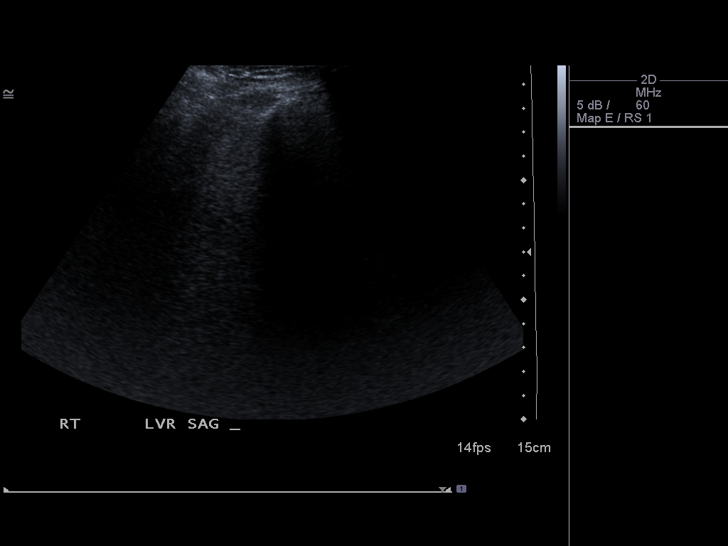
[im 21/63]
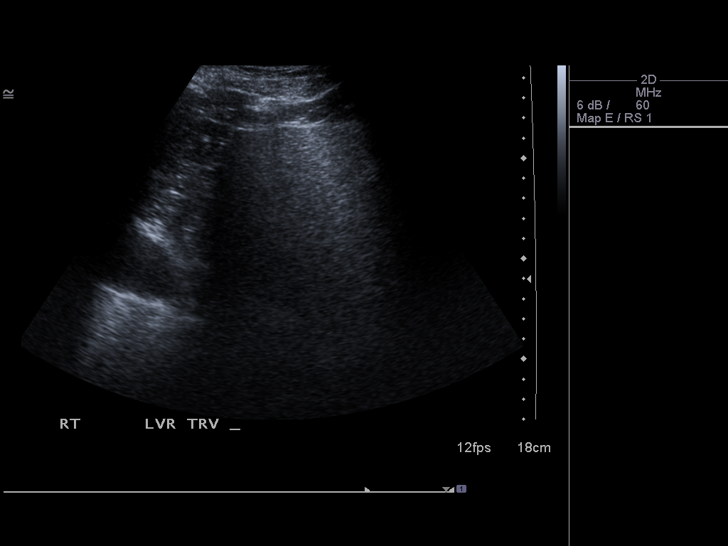
[im 26/63]
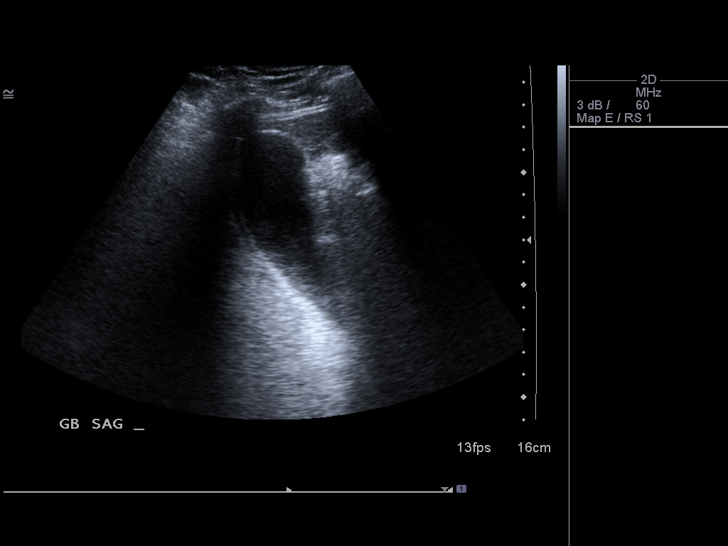
[im 32/63]
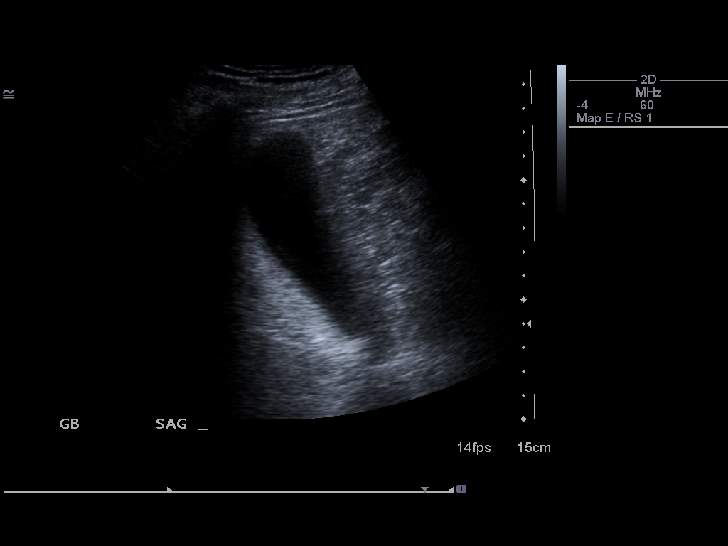
[im 37/63]
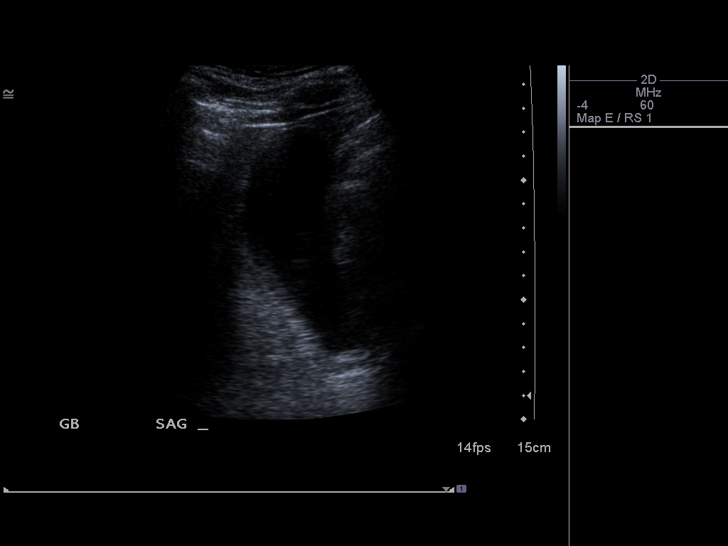
[im 42/63]
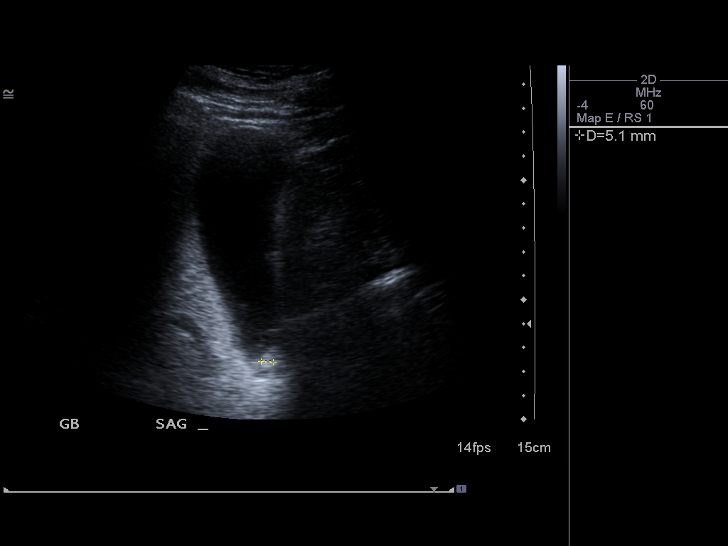
[im 47/63]
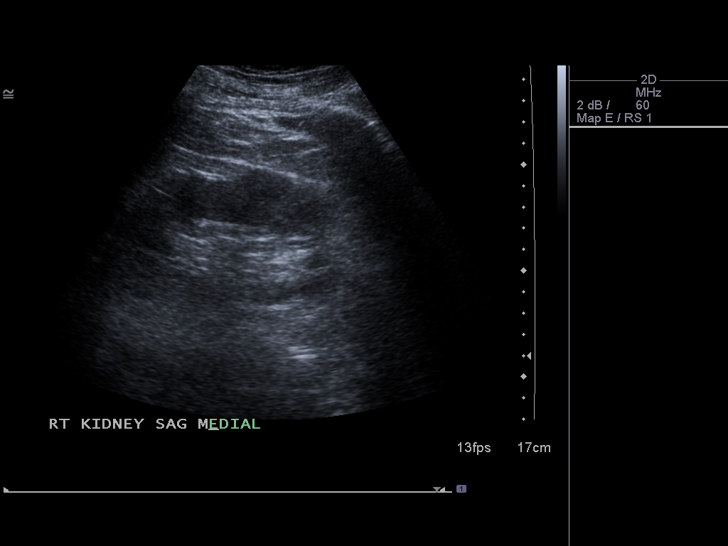
[im 52/63]
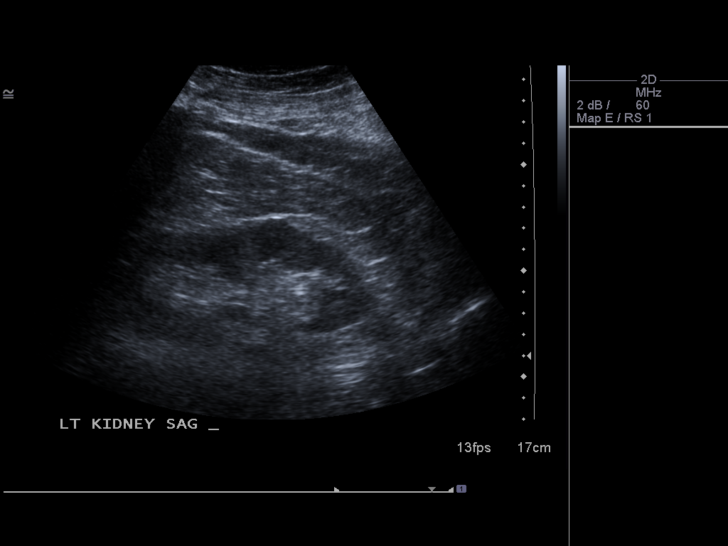
[im 57/63]
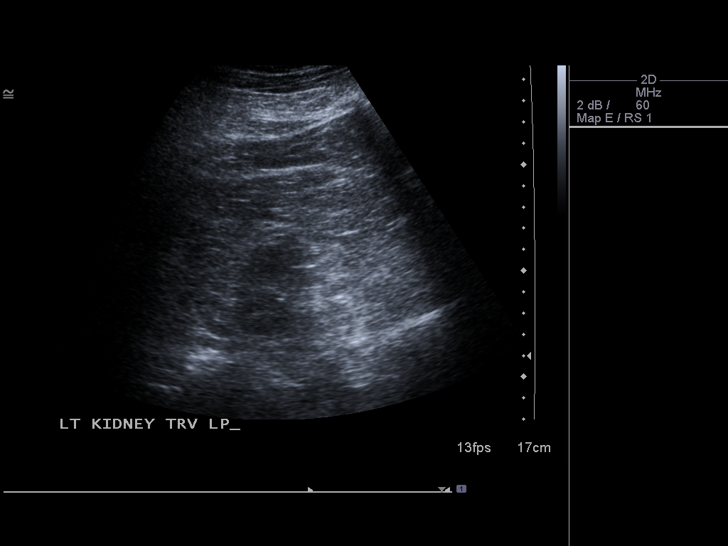
[im 63/63]
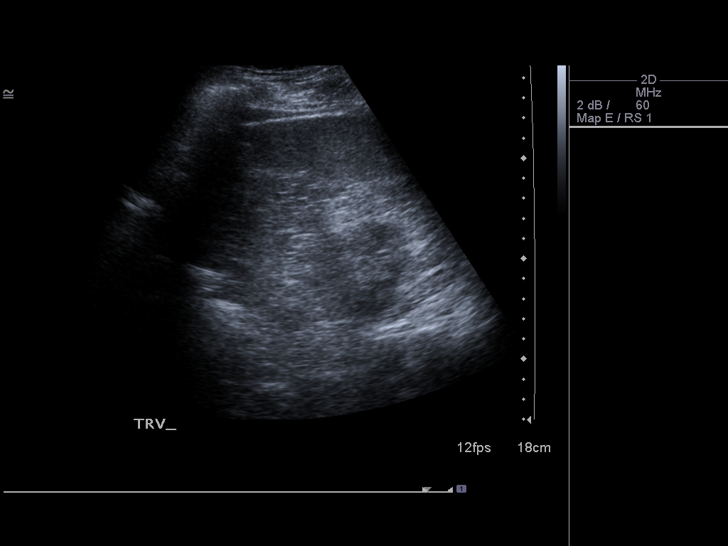

[13 of 25 positions shown; findings below may reference images not displayed]

FINDINGS: Gallbladder:

Echogenic focus at the neck of the gallbladder measuring 5 mm
consistent with an gallstones. Wall thickness 2.8 mm. Negative
sonographic Murphy's sign.

Common bile duct:

Diameter: Unable to measure due to large amount of bowel gas.

Liver:

Diffuse increased echogenicity without focal defects. Findings
consistent with steatosis.

IVC:

Limited visualization.  No definite abnormalities.

Pancreas:

Obscured due to bowel gas.

Spleen:

Size and appearance within normal limits.

Right Kidney:

Length: 11.7 cm.. Echogenicity within normal limits. No mass or
hydronephrosis visualized.

Left Kidney:

Length: 11.7 cm.. Echogenicity within normal limits. No mass or
hydronephrosis visualized.

Abdominal aorta:

No aneurysm visualized.

Other findings:

None.

Good agreement with prior CT.
IMPRESSION: Cholelithiasis without signs of acute cholecystitis. No
pericholecystic fluid or gallbladder wall thickening. Negative
sonographic Murphy's sign.

No visualized intrahepatic biliary ductal dilatation. Extrahepatic
biliary tree not well seen due to gas.

Increased liver echogenicity consistent with steatosis.

## 2017-10-14 DIAGNOSIS — M542 Cervicalgia: Secondary | ICD-10-CM | POA: Diagnosis not present

## 2018-01-04 DIAGNOSIS — M542 Cervicalgia: Secondary | ICD-10-CM | POA: Diagnosis not present

## 2018-01-12 DIAGNOSIS — R110 Nausea: Secondary | ICD-10-CM | POA: Diagnosis not present

## 2018-01-12 DIAGNOSIS — J0100 Acute maxillary sinusitis, unspecified: Secondary | ICD-10-CM | POA: Diagnosis not present

## 2018-01-12 DIAGNOSIS — R430 Anosmia: Secondary | ICD-10-CM | POA: Diagnosis not present

## 2018-01-12 DIAGNOSIS — R51 Headache: Secondary | ICD-10-CM | POA: Diagnosis not present

## 2018-01-19 DIAGNOSIS — R430 Anosmia: Secondary | ICD-10-CM | POA: Insufficient documentation

## 2018-01-19 DIAGNOSIS — R519 Headache, unspecified: Secondary | ICD-10-CM | POA: Insufficient documentation

## 2018-01-19 DIAGNOSIS — R51 Headache: Secondary | ICD-10-CM | POA: Diagnosis not present

## 2018-01-25 DIAGNOSIS — M542 Cervicalgia: Secondary | ICD-10-CM | POA: Diagnosis not present

## 2018-01-29 DIAGNOSIS — Z6832 Body mass index (BMI) 32.0-32.9, adult: Secondary | ICD-10-CM | POA: Diagnosis not present

## 2018-01-29 DIAGNOSIS — M542 Cervicalgia: Secondary | ICD-10-CM | POA: Diagnosis not present

## 2018-01-29 DIAGNOSIS — R030 Elevated blood-pressure reading, without diagnosis of hypertension: Secondary | ICD-10-CM | POA: Diagnosis not present

## 2018-02-09 DIAGNOSIS — H43813 Vitreous degeneration, bilateral: Secondary | ICD-10-CM | POA: Diagnosis not present

## 2018-02-15 DIAGNOSIS — M9902 Segmental and somatic dysfunction of thoracic region: Secondary | ICD-10-CM | POA: Diagnosis not present

## 2018-02-15 DIAGNOSIS — M9901 Segmental and somatic dysfunction of cervical region: Secondary | ICD-10-CM | POA: Diagnosis not present

## 2018-02-15 DIAGNOSIS — M5032 Other cervical disc degeneration, mid-cervical region, unspecified level: Secondary | ICD-10-CM | POA: Diagnosis not present

## 2018-02-15 DIAGNOSIS — G44201 Tension-type headache, unspecified, intractable: Secondary | ICD-10-CM | POA: Diagnosis not present

## 2018-02-19 DIAGNOSIS — M9902 Segmental and somatic dysfunction of thoracic region: Secondary | ICD-10-CM | POA: Diagnosis not present

## 2018-02-19 DIAGNOSIS — G44201 Tension-type headache, unspecified, intractable: Secondary | ICD-10-CM | POA: Diagnosis not present

## 2018-02-19 DIAGNOSIS — M5032 Other cervical disc degeneration, mid-cervical region, unspecified level: Secondary | ICD-10-CM | POA: Diagnosis not present

## 2018-02-19 DIAGNOSIS — M9901 Segmental and somatic dysfunction of cervical region: Secondary | ICD-10-CM | POA: Diagnosis not present

## 2018-02-22 DIAGNOSIS — M9902 Segmental and somatic dysfunction of thoracic region: Secondary | ICD-10-CM | POA: Diagnosis not present

## 2018-02-22 DIAGNOSIS — M9901 Segmental and somatic dysfunction of cervical region: Secondary | ICD-10-CM | POA: Diagnosis not present

## 2018-02-22 DIAGNOSIS — G44201 Tension-type headache, unspecified, intractable: Secondary | ICD-10-CM | POA: Diagnosis not present

## 2018-02-22 DIAGNOSIS — M5032 Other cervical disc degeneration, mid-cervical region, unspecified level: Secondary | ICD-10-CM | POA: Diagnosis not present

## 2018-02-26 DIAGNOSIS — M9901 Segmental and somatic dysfunction of cervical region: Secondary | ICD-10-CM | POA: Diagnosis not present

## 2018-02-26 DIAGNOSIS — M5032 Other cervical disc degeneration, mid-cervical region, unspecified level: Secondary | ICD-10-CM | POA: Diagnosis not present

## 2018-02-26 DIAGNOSIS — G44201 Tension-type headache, unspecified, intractable: Secondary | ICD-10-CM | POA: Diagnosis not present

## 2018-02-26 DIAGNOSIS — M9902 Segmental and somatic dysfunction of thoracic region: Secondary | ICD-10-CM | POA: Diagnosis not present

## 2018-03-01 DIAGNOSIS — M9902 Segmental and somatic dysfunction of thoracic region: Secondary | ICD-10-CM | POA: Diagnosis not present

## 2018-03-01 DIAGNOSIS — G44201 Tension-type headache, unspecified, intractable: Secondary | ICD-10-CM | POA: Diagnosis not present

## 2018-03-01 DIAGNOSIS — M9901 Segmental and somatic dysfunction of cervical region: Secondary | ICD-10-CM | POA: Diagnosis not present

## 2018-03-01 DIAGNOSIS — M5032 Other cervical disc degeneration, mid-cervical region, unspecified level: Secondary | ICD-10-CM | POA: Diagnosis not present

## 2018-03-14 DIAGNOSIS — M9901 Segmental and somatic dysfunction of cervical region: Secondary | ICD-10-CM | POA: Diagnosis not present

## 2018-03-14 DIAGNOSIS — G44201 Tension-type headache, unspecified, intractable: Secondary | ICD-10-CM | POA: Diagnosis not present

## 2018-03-14 DIAGNOSIS — M5032 Other cervical disc degeneration, mid-cervical region, unspecified level: Secondary | ICD-10-CM | POA: Diagnosis not present

## 2018-03-14 DIAGNOSIS — M9902 Segmental and somatic dysfunction of thoracic region: Secondary | ICD-10-CM | POA: Diagnosis not present

## 2018-03-15 DIAGNOSIS — M9902 Segmental and somatic dysfunction of thoracic region: Secondary | ICD-10-CM | POA: Diagnosis not present

## 2018-03-15 DIAGNOSIS — G44201 Tension-type headache, unspecified, intractable: Secondary | ICD-10-CM | POA: Diagnosis not present

## 2018-03-15 DIAGNOSIS — M9901 Segmental and somatic dysfunction of cervical region: Secondary | ICD-10-CM | POA: Diagnosis not present

## 2018-03-15 DIAGNOSIS — M5032 Other cervical disc degeneration, mid-cervical region, unspecified level: Secondary | ICD-10-CM | POA: Diagnosis not present

## 2018-03-21 DIAGNOSIS — M9901 Segmental and somatic dysfunction of cervical region: Secondary | ICD-10-CM | POA: Diagnosis not present

## 2018-03-21 DIAGNOSIS — G44201 Tension-type headache, unspecified, intractable: Secondary | ICD-10-CM | POA: Diagnosis not present

## 2018-03-21 DIAGNOSIS — M5032 Other cervical disc degeneration, mid-cervical region, unspecified level: Secondary | ICD-10-CM | POA: Diagnosis not present

## 2018-03-21 DIAGNOSIS — M9902 Segmental and somatic dysfunction of thoracic region: Secondary | ICD-10-CM | POA: Diagnosis not present

## 2018-04-13 DIAGNOSIS — R062 Wheezing: Secondary | ICD-10-CM | POA: Diagnosis not present

## 2018-04-13 DIAGNOSIS — J209 Acute bronchitis, unspecified: Secondary | ICD-10-CM | POA: Diagnosis not present

## 2018-04-19 DIAGNOSIS — R062 Wheezing: Secondary | ICD-10-CM | POA: Diagnosis not present

## 2018-04-19 DIAGNOSIS — J209 Acute bronchitis, unspecified: Secondary | ICD-10-CM | POA: Diagnosis not present

## 2018-05-17 DIAGNOSIS — M66821 Spontaneous rupture of other tendons, right upper arm: Secondary | ICD-10-CM | POA: Diagnosis not present

## 2018-05-18 DIAGNOSIS — M25521 Pain in right elbow: Secondary | ICD-10-CM | POA: Diagnosis not present

## 2019-10-28 DIAGNOSIS — R079 Chest pain, unspecified: Secondary | ICD-10-CM | POA: Diagnosis not present

## 2019-10-28 DIAGNOSIS — R252 Cramp and spasm: Secondary | ICD-10-CM | POA: Diagnosis not present

## 2019-10-28 DIAGNOSIS — R5383 Other fatigue: Secondary | ICD-10-CM | POA: Diagnosis not present

## 2019-10-28 DIAGNOSIS — Z1331 Encounter for screening for depression: Secondary | ICD-10-CM | POA: Diagnosis not present

## 2019-11-12 DIAGNOSIS — G4733 Obstructive sleep apnea (adult) (pediatric): Secondary | ICD-10-CM | POA: Diagnosis not present

## 2019-11-12 DIAGNOSIS — R0602 Shortness of breath: Secondary | ICD-10-CM | POA: Diagnosis not present

## 2019-11-13 DIAGNOSIS — G4733 Obstructive sleep apnea (adult) (pediatric): Secondary | ICD-10-CM | POA: Diagnosis not present

## 2019-11-13 DIAGNOSIS — R0602 Shortness of breath: Secondary | ICD-10-CM | POA: Diagnosis not present

## 2019-12-18 DIAGNOSIS — K219 Gastro-esophageal reflux disease without esophagitis: Secondary | ICD-10-CM | POA: Insufficient documentation

## 2020-07-23 ENCOUNTER — Other Ambulatory Visit: Payer: Self-pay | Admitting: Gastroenterology

## 2020-07-23 DIAGNOSIS — R109 Unspecified abdominal pain: Secondary | ICD-10-CM

## 2020-07-29 ENCOUNTER — Other Ambulatory Visit: Payer: Self-pay | Admitting: Gastroenterology

## 2020-07-29 ENCOUNTER — Ambulatory Visit
Admission: RE | Admit: 2020-07-29 | Discharge: 2020-07-29 | Disposition: A | Discharge status: Home / Self Care | Payer: 59 | Source: Ambulatory Visit | Attending: Gastroenterology | Admitting: Gastroenterology

## 2020-07-29 DIAGNOSIS — R1084 Generalized abdominal pain: Secondary | ICD-10-CM

## 2020-07-29 DIAGNOSIS — R109 Unspecified abdominal pain: Secondary | ICD-10-CM

## 2020-09-04 ENCOUNTER — Other Ambulatory Visit: Payer: Self-pay | Admitting: Gastroenterology

## 2020-09-04 DIAGNOSIS — R634 Abnormal weight loss: Secondary | ICD-10-CM

## 2020-09-04 DIAGNOSIS — R198 Other specified symptoms and signs involving the digestive system and abdomen: Secondary | ICD-10-CM

## 2020-09-04 DIAGNOSIS — R109 Unspecified abdominal pain: Secondary | ICD-10-CM

## 2020-09-17 ENCOUNTER — Ambulatory Visit
Admission: RE | Admit: 2020-09-17 | Discharge: 2020-09-17 | Disposition: A | Discharge status: Home / Self Care | Payer: 59 | Source: Ambulatory Visit | Attending: Gastroenterology | Admitting: Gastroenterology

## 2020-09-17 ENCOUNTER — Other Ambulatory Visit: Payer: Self-pay

## 2020-09-17 DIAGNOSIS — R198 Other specified symptoms and signs involving the digestive system and abdomen: Secondary | ICD-10-CM

## 2020-09-17 DIAGNOSIS — R634 Abnormal weight loss: Secondary | ICD-10-CM

## 2020-09-17 DIAGNOSIS — R109 Unspecified abdominal pain: Secondary | ICD-10-CM

## 2020-09-17 MED ORDER — IOPAMIDOL (ISOVUE-300) INJECTION 61%
100.0000 mL | Freq: Once | INTRAVENOUS | Status: AC | PRN
  Administered 2020-09-17: 100 mL via INTRAVENOUS

## 2021-08-31 IMAGING — DX DG ABDOMEN 2V
2 series · 2 of 2 positions shown · non-contrast
Comparison: None.
COMPARISON: None.

Addendum:
CLINICAL DATA: Abdominal pain x1 month.

EXAM:
ABDOMEN - 2 VIEW

[dg abd 2 views (1 of 2)]
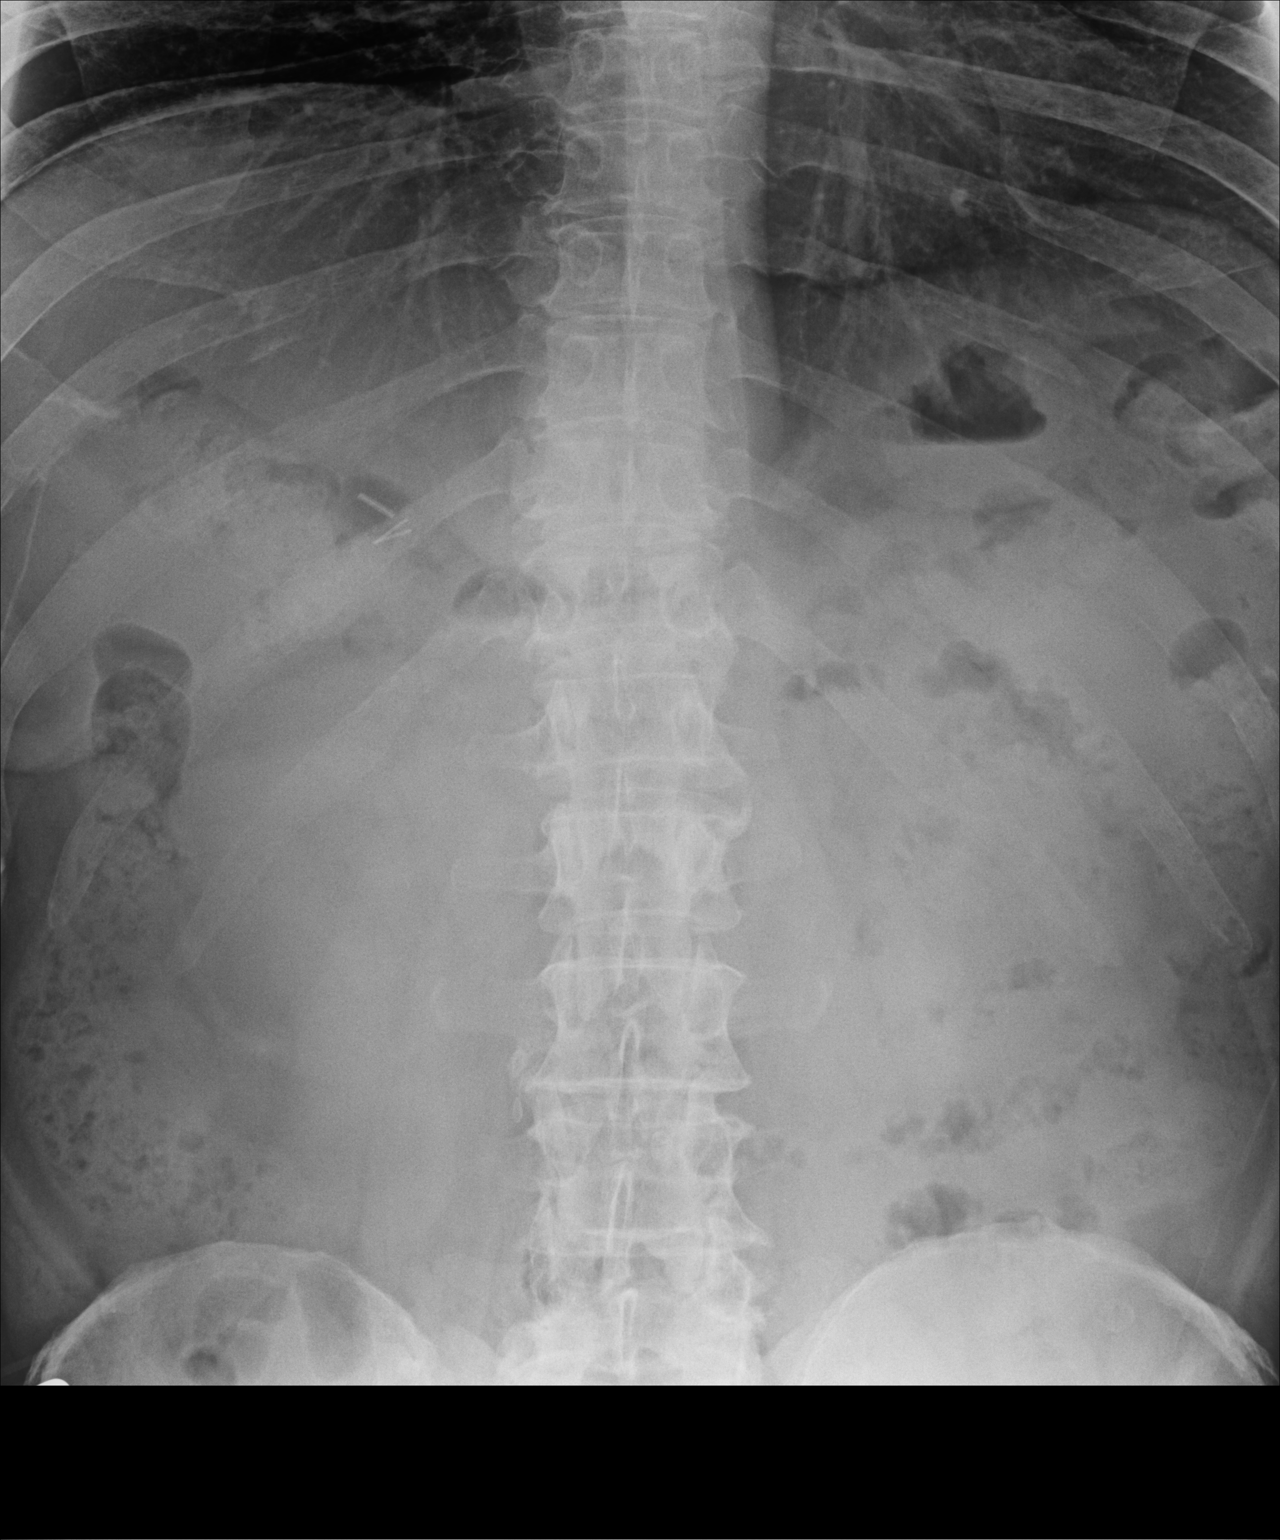

[dg abd 2 views (2 of 2)]
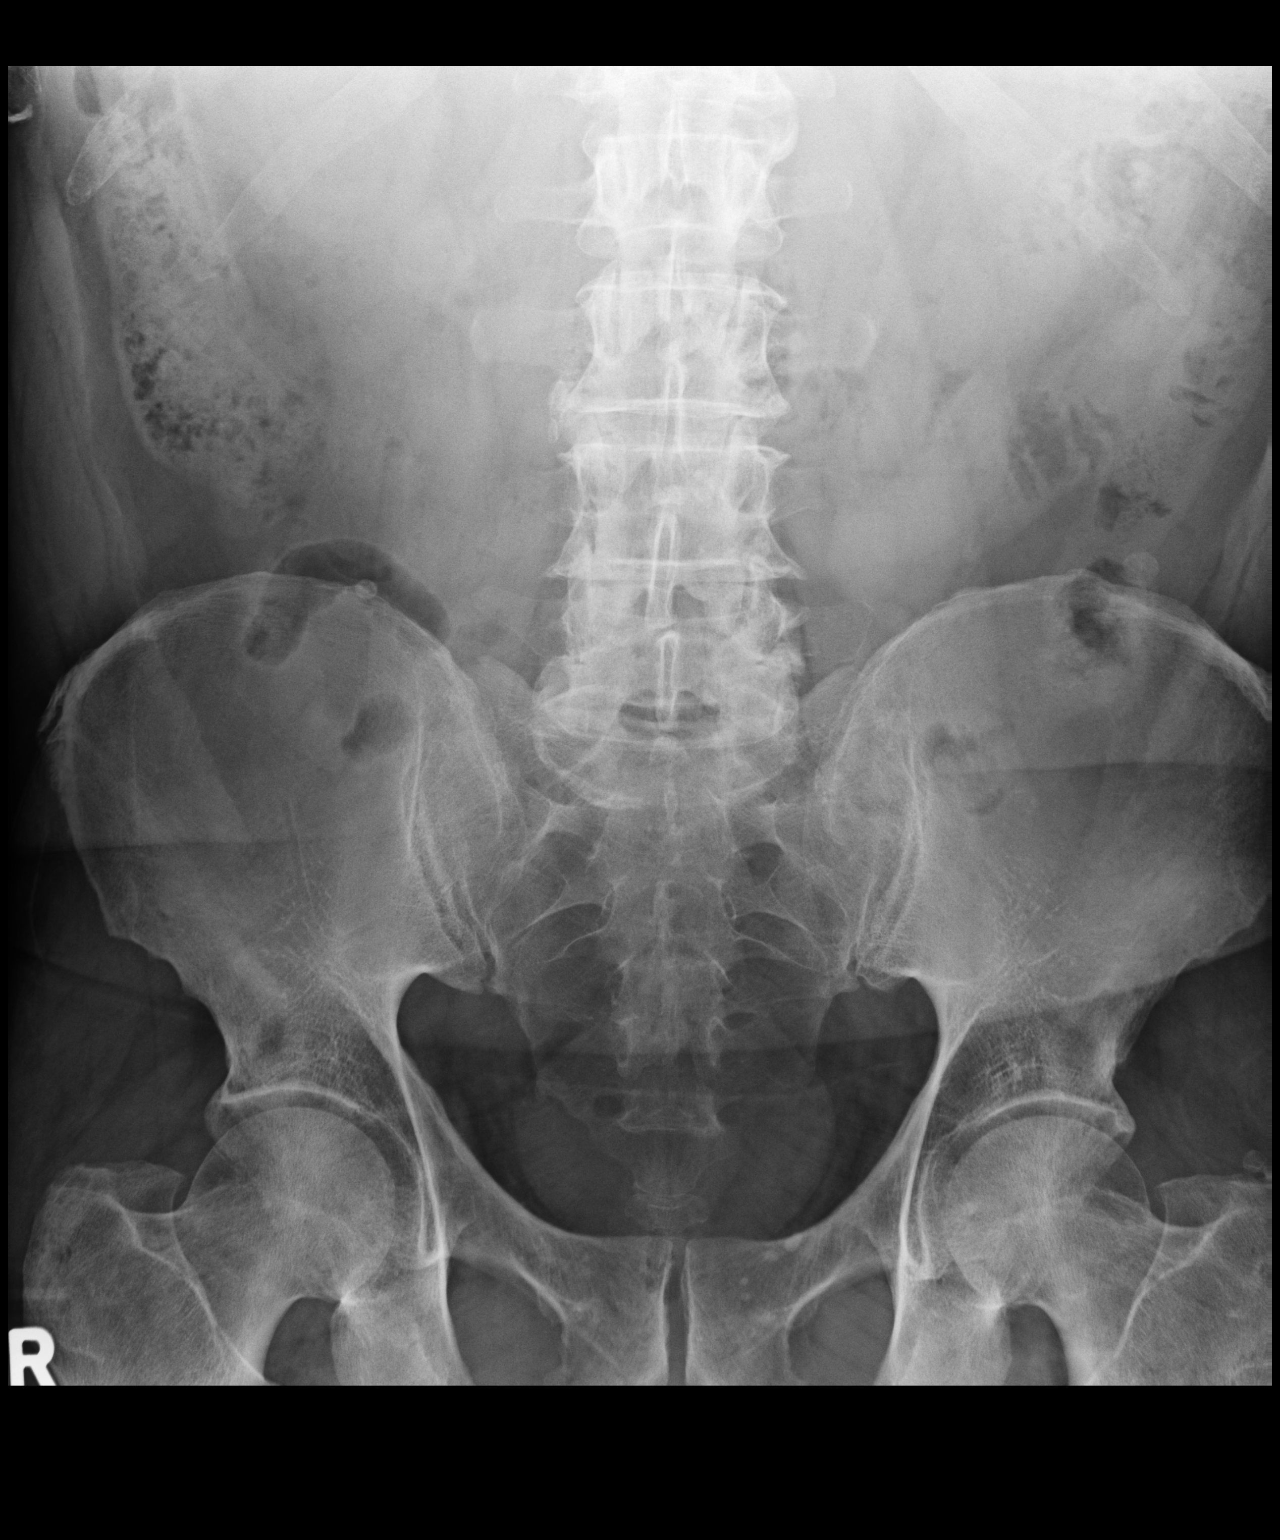

[2 of 2 positions shown; findings below may reference images not displayed]

FINDINGS: The bowel gas pattern is normal. There is no evidence of free air.
No radio-opaque calculi or other significant radiographic
abnormality is seen. Radiopaque surgical clips are seen overlying
the right upper quadrant.
IMPRESSION: Negative.

ADDENDUM:
The referring provider requested comment on the amount of stool
present. A small amount of colonic stool is seen in the right and
left colon.

*** End of Addendum ***
FINDINGS: The bowel gas pattern is normal. There is no evidence of free air.
No radio-opaque calculi or other significant radiographic
abnormality is seen. Radiopaque surgical clips are seen overlying
the right upper quadrant.
IMPRESSION: Negative.

## 2022-11-22 DIAGNOSIS — M25511 Pain in right shoulder: Secondary | ICD-10-CM | POA: Insufficient documentation

## 2022-11-22 DIAGNOSIS — M7700 Medial epicondylitis, unspecified elbow: Secondary | ICD-10-CM | POA: Insufficient documentation

## 2022-11-25 ENCOUNTER — Ambulatory Visit (INDEPENDENT_AMBULATORY_CARE_PROVIDER_SITE_OTHER): Payer: Commercial Managed Care - HMO

## 2022-11-25 ENCOUNTER — Ambulatory Visit: Payer: Commercial Managed Care - HMO | Admitting: Surgery

## 2022-11-25 ENCOUNTER — Encounter: Payer: Self-pay | Admitting: Surgery

## 2022-11-25 DIAGNOSIS — M7542 Impingement syndrome of left shoulder: Secondary | ICD-10-CM | POA: Diagnosis not present

## 2022-11-25 DIAGNOSIS — M25512 Pain in left shoulder: Secondary | ICD-10-CM

## 2022-11-25 DIAGNOSIS — M7541 Impingement syndrome of right shoulder: Secondary | ICD-10-CM

## 2022-11-25 DIAGNOSIS — G8929 Other chronic pain: Secondary | ICD-10-CM

## 2022-11-25 DIAGNOSIS — M25511 Pain in right shoulder: Secondary | ICD-10-CM | POA: Diagnosis not present

## 2022-11-25 NOTE — Progress Notes (Signed)
Office Visit Note   Patient: Casey Hutchinson           Date of Birth: 01/28/1966           MRN: [id number] Visit Date: 11/25/2022              Requested by: Kaleen Mask, MD [address] [place],  [place] [address] PCP: Kaleen Mask, MD   Assessment & Plan: Visit Diagnoses:  1. Chronic pain of both shoulders   2. Impingement syndrome of left shoulder   3. Impingement syndrome of right shoulder     Plan: In hopes of giving patient improvement of his bilateral shoulder pain offered injections.  The patient send bilateral shoulders were prepped with Betadine and subacromial Marcaine/Depo-Medrol injections were performed.  Tolerated without complication.  He will follow-up with Dr. August Saucer in 3 weeks for recheck.  If he continues to be symptomatic he may consider getting MRI scans to rule out rotator cuff tear and other shoulder pathology.  Follow-Up Instructions: Return in about 3 weeks (around 12/16/2022) for WITH DR August Saucer FOR RECHECK BILATERAL LEFT GREATER THAN RIGHT SHOULDER PAIN.   Orders:  Orders Placed This Encounter  Procedures   Large Joint Inj: bilateral subacromial bursa   XR Shoulder Right   XR Shoulder Left   No orders of the defined types were placed in this encounter.     Procedures: Large Joint Inj: bilateral subacromial bursa on 11/25/2022 3:38 PM Indications: pain Details: 25 G 1.5 in needle, posterior approach Medications (Right): 3 mL lidocaine 1 % Medications (Left): 3 mL lidocaine 1 %      Clinical Data: No additional findings.   Subjective: Chief Complaint  Patient presents with   Right Shoulder - Pain   Left Shoulder - Pain    HPI 56 year old male comes in with complaints of bilateral shoulder pain.  He is a new patient to the office.  Bile shoulder pain off-and-on x 3 to 4 months.  No injury.  Bilateral shoulder pain aggravated with overactivity and reaching on his back.  Does awaken him at night when he lays on both  sides.  States that he is status post left shoulder scope by Dr. Luiz Blare about 12 years ago.  No previous surgery on right shoulder.  Denies cervical spine or radicular component. Review of Systems No current complaints of cardiopulmonary GI/GU issues.  Objective: Vital Signs: There were no vitals taken for this visit.  Physical Exam Constitutional:      Appearance: Normal appearance.  HENT:     Head: Normocephalic and atraumatic.     Nose: Nose normal.  Eyes:     Extraocular Movements: Extraocular movements intact.  Musculoskeletal:     Comments: Cervical spine unremarkable.  Bilateral shoulders he has good range of motion but with discomfort.  Positive left greater than right shoulder impingement test.  Negative drop arm test.  Positive left greater than right shoulder pain with supraspinatus resistance.  Neurological:     General: No focal deficit present.     Mental Status: He is alert and oriented to person, place, and time.  Psychiatric:        Mood and Affect: Mood normal.     Ortho Exam  Specialty Comments:  No specialty comments available.  Imaging: No results found.   PMFS History: Patient Active Problem List   Diagnosis Date Noted   Chronic cholecystitis with calculus 08/06/2014   Gallstones 08/04/2014   Symptomatic cholelithiasis 08/04/2014  Past Medical History:  Diagnosis Date   Cholelithiasis    GERD (gastroesophageal reflux disease)     Family History  Problem Relation Age of Onset   COPD Mother     Past Surgical History:  Procedure Laterality Date   CHOLECYSTECTOMY N/A 08/05/2014   Procedure: LAPAROSCOPIC CHOLECYSTECTOMY;  Surgeon: Emelia Loron, MD;  Location: [facility];  Service: General;  Laterality: N/A;   HAND SURGERY     carpel tunnel   SHOULDER SURGERY     VASECTOMY     Social History   Occupational History   Not on file  Tobacco Use   Smoking status: Never   Smokeless tobacco: Never  Substance and Sexual Activity   Alcohol  use: No   Drug use: No   Sexual activity: Not on file

## 2022-12-01 MED ORDER — LIDOCAINE HCL 1 % IJ SOLN
3.0000 mL | INTRAMUSCULAR | Status: AC | PRN
  Administered 2022-11-25: 3 mL

## 2022-12-14 ENCOUNTER — Ambulatory Visit: Payer: Commercial Managed Care - HMO | Admitting: Orthopedic Surgery

## 2023-01-04 DIAGNOSIS — M9901 Segmental and somatic dysfunction of cervical region: Secondary | ICD-10-CM | POA: Diagnosis not present

## 2023-01-04 DIAGNOSIS — M9902 Segmental and somatic dysfunction of thoracic region: Secondary | ICD-10-CM | POA: Diagnosis not present

## 2023-01-04 DIAGNOSIS — R519 Headache, unspecified: Secondary | ICD-10-CM | POA: Diagnosis not present

## 2023-01-09 ENCOUNTER — Ambulatory Visit (INDEPENDENT_AMBULATORY_CARE_PROVIDER_SITE_OTHER): Payer: 59

## 2023-01-09 ENCOUNTER — Ambulatory Visit: Payer: 59 | Admitting: Orthopedic Surgery

## 2023-01-09 ENCOUNTER — Encounter: Payer: Self-pay | Admitting: Orthopedic Surgery

## 2023-01-09 DIAGNOSIS — M5412 Radiculopathy, cervical region: Secondary | ICD-10-CM

## 2023-01-09 DIAGNOSIS — M7541 Impingement syndrome of right shoulder: Secondary | ICD-10-CM | POA: Diagnosis not present

## 2023-01-09 DIAGNOSIS — M7542 Impingement syndrome of left shoulder: Secondary | ICD-10-CM

## 2023-01-09 MED ORDER — METHYLPREDNISOLONE ACETATE 40 MG/ML IJ SUSP
40.0000 mg | INTRAMUSCULAR | Status: AC | PRN
  Administered 2023-01-09: 40 mg via INTRA_ARTICULAR

## 2023-01-09 MED ORDER — LIDOCAINE HCL 1 % IJ SOLN
5.0000 mL | INTRAMUSCULAR | Status: AC | PRN
  Administered 2023-01-09: 5 mL

## 2023-01-09 MED ORDER — BUPIVACAINE HCL 0.5 % IJ SOLN
9.0000 mL | INTRAMUSCULAR | Status: AC | PRN
  Administered 2023-01-09: 9 mL via INTRA_ARTICULAR

## 2023-01-09 MED ORDER — TRAMADOL HCL 50 MG PO TABS: ORAL_TABLET | ORAL | 0 refills | Status: DC

## 2023-01-09 NOTE — Progress Notes (Addendum)
Office Visit Note   Patient: Casey Hutchinson           Date of Birth: December 17, 1966           MRN: [id number] Visit Date: 01/09/2023 Requested by: Leonard Downing, MD [address] [place],  [place] [address] PCP: Leonard Downing, MD  Subjective: Chief Complaint  Patient presents with   Right Shoulder - Pain   Left Shoulder - Pain    HPI: Casey Hutchinson is a 57 y.o. male who presents to the office reporting bilateral shoulder pain and neck pain.  Patient had bilateral subacromial injections 11/25/2022.  Did get good good relief for about 2 weeks.  He has had chronic pain in both shoulders and to some degree the neck for the past 8 months.  Denies any history of injury.  Reports right equal to left pain.  He is right-hand dominant.  Pain wakes him from sleep at night.  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  Denies any scapular pain.  Has occasional numbness and tingling with certain movements.  Does report some popping.  Tried Mobic without relief.  Has had prior left shoulder arthroscopy and "spur removal" about 12 years ago by another physician..                ROS: All systems reviewed are negative as they relate to the chief complaint within the history of present illness.  Patient denies fevers or chills.  Assessment & Plan: Visit Diagnoses:  1. Radiculopathy, cervical region   2. Impingement syndrome of right shoulder   3. Impingement syndrome of left shoulder     Plan: Impression is bilateral shoulder pain with weakness more prominent on the left hand mechanical symptoms more prominent on the right.  He does have signs and symptoms most consistent with shoulder pathology with positive response to prior injection and symptoms which are aggravated by shoulder range of motion and manipulation.  Plan is MRI arthrogram of both shoulders to evaluate for rotator cuff pathology.  Nine 1 injection into both  shoulders performed again today for temporary pain relief.  Tramadol prescribed to be taken at night.  Follow-up after the scans.  If the scans are negative or if he does not get.  Predictable response from the subacromial injections and we could consider further imaging of the cervical spine.   Luke assisted in the care of this patient Follow-Up Instructions: No follow-ups on file.   Orders:  Orders Placed This Encounter  Procedures   XR Cervical Spine 2 or 3 views   Arthrogram   MR SHOULDER RIGHT W CONTRAST   MR Shoulder Left w/ contrast   Arthrogram   Meds ordered this encounter  Medications   traMADol (ULTRAM) 50 MG tablet    Sig: 1 po q hs prn    Dispense:  30 tablet    Refill:  0      Procedures: Large Joint Inj: R subacromial bursa on 01/09/2023 10:04 PM Indications: diagnostic evaluation and pain Details: 18 G 1.5 in needle, posterior approach  Arthrogram: No  Medications: 9 mL bupivacaine 0.5 %; 40 mg methylPREDNISolone acetate 40 MG/ML; 5 mL lidocaine 1 % Outcome: tolerated well, no immediate complications Procedure, treatment alternatives, risks and benefits explained, specific risks discussed. Consent was given by the patient. Immediately prior to procedure a time out was called to verify the correct patient, procedure, equipment, support staff and  site/side marked as required. Patient was prepped and draped in the usual sterile fashion.    Large Joint Inj: L subacromial bursa on 01/09/2023 10:05 PM Indications: diagnostic evaluation and pain Details: 18 G 1.5 in needle, posterior approach  Arthrogram: No  Medications: 9 mL bupivacaine 0.5 %; 40 mg methylPREDNISolone acetate 40 MG/ML; 5 mL lidocaine 1 % Outcome: tolerated well, no immediate complications Procedure, treatment alternatives, risks and benefits explained, specific risks discussed. Consent was given by the patient. Immediately prior to procedure a time out was called to verify the correct patient,  procedure, equipment, support staff and site/side marked as required. Patient was prepped and draped in the usual sterile fashion.       Clinical Data: No additional findings.  Objective: Vital Signs: There were no vitals taken for this visit.  Physical Exam:  Constitutional: Patient appears well-developed HEENT:  Head: Normocephalic Eyes:EOM are normal Neck: Normal range of motion Cardiovascular: Normal rate Pulmonary/chest: Effort normal Neurologic: Patient is alert Skin: Skin is warm Psychiatric: Patient has normal mood and affect  Ortho Exam: Ortho exam demonstrates range of motion of both shoulders of 70/105/170 with slight weakness on the left to infraspinatus testing of 5- out of 5 compared to 5+ out of 5 on the right.  No discrete AC joint tenderness bilaterally.  O'Brien's testing is positive bilaterally.  He does have more coarseness on the right with internal and external rotation of the right arm compared to the left.  Cervical spine range of motion is full.  No paresthesias C5-T1.  Radial pulse intact bilaterally.  No muscle atrophy in either arm.  5 out of 5 grip EPL FPL interosseous resection extension bicep triceps and deltoid strength.  Specialty Comments:  No specialty comments available.  Imaging: XR Cervical Spine 2 or 3 views  Result Date: 01/09/2023 AP lateral radiographs cervical spine reviewed.  Minimal degenerative changes noted at C4-5 and C5-6 with slight anterior osteophytic spurring.  Normal lordosis is present.  No acute fracture.  No spondylolisthesis.  Minimal to no facet arthritis present.    PMFS History: Patient Active Problem List   Diagnosis Date Noted   Chronic cholecystitis with calculus 08/06/2014   Gallstones 08/04/2014   Symptomatic cholelithiasis 08/04/2014   Past Medical History:  Diagnosis Date   Cholelithiasis    GERD (gastroesophageal reflux disease)     Family History  Problem Relation Age of Onset   COPD Mother      Past Surgical History:  Procedure Laterality Date   CHOLECYSTECTOMY N/A 08/05/2014   Procedure: LAPAROSCOPIC CHOLECYSTECTOMY;  Surgeon: Rolm Bookbinder, MD;  Location: [facility];  Service: General;  Laterality: N/A;   HAND SURGERY     carpel tunnel   SHOULDER SURGERY     VASECTOMY     Social History   Occupational History   Not on file  Tobacco Use   Smoking status: Never   Smokeless tobacco: Never  Substance and Sexual Activity   Alcohol use: No   Drug use: No   Sexual activity: Not on file

## 2023-01-17 DIAGNOSIS — R519 Headache, unspecified: Secondary | ICD-10-CM | POA: Diagnosis not present

## 2023-01-17 DIAGNOSIS — M9901 Segmental and somatic dysfunction of cervical region: Secondary | ICD-10-CM | POA: Diagnosis not present

## 2023-01-17 DIAGNOSIS — M9902 Segmental and somatic dysfunction of thoracic region: Secondary | ICD-10-CM | POA: Diagnosis not present

## 2023-01-30 ENCOUNTER — Ambulatory Visit
Admission: RE | Admit: 2023-01-30 | Discharge: 2023-01-30 | Disposition: A | Discharge status: Home / Self Care | Payer: 59 | Source: Ambulatory Visit | Attending: Orthopedic Surgery | Admitting: Orthopedic Surgery

## 2023-01-30 ENCOUNTER — Inpatient Hospital Stay: Admission: RE | Admit: 2023-01-30 | Payer: 59 | Source: Ambulatory Visit

## 2023-01-30 ENCOUNTER — Other Ambulatory Visit: Payer: 59

## 2023-01-30 DIAGNOSIS — S46011A Strain of muscle(s) and tendon(s) of the rotator cuff of right shoulder, initial encounter: Secondary | ICD-10-CM | POA: Diagnosis not present

## 2023-01-30 DIAGNOSIS — M7541 Impingement syndrome of right shoulder: Secondary | ICD-10-CM

## 2023-01-30 DIAGNOSIS — M7542 Impingement syndrome of left shoulder: Secondary | ICD-10-CM

## 2023-01-30 MED ORDER — IOPAMIDOL (ISOVUE-M 200) INJECTION 41%
15.0000 mL | Freq: Once | INTRAMUSCULAR | Status: AC
  Administered 2023-01-30: 15 mL via INTRA_ARTICULAR

## 2023-02-01 DIAGNOSIS — R519 Headache, unspecified: Secondary | ICD-10-CM | POA: Diagnosis not present

## 2023-02-01 DIAGNOSIS — M9902 Segmental and somatic dysfunction of thoracic region: Secondary | ICD-10-CM | POA: Diagnosis not present

## 2023-02-01 DIAGNOSIS — M9901 Segmental and somatic dysfunction of cervical region: Secondary | ICD-10-CM | POA: Diagnosis not present

## 2023-02-08 ENCOUNTER — Ambulatory Visit: Payer: 59 | Admitting: Surgical

## 2023-02-08 DIAGNOSIS — M75121 Complete rotator cuff tear or rupture of right shoulder, not specified as traumatic: Secondary | ICD-10-CM

## 2023-02-12 ENCOUNTER — Encounter: Payer: Self-pay | Admitting: Surgical

## 2023-02-12 NOTE — Progress Notes (Signed)
Office Visit Note   Patient: Casey Hutchinson           Date of Birth: 05/17/1966           MRN: [id number] Visit Date: 02/08/2023 Requested by: Leonard Downing, MD [address] [place],  [place] [address] PCP: Leonard Downing, MD  Subjective: Chief Complaint  Patient presents with   Right Shoulder - Follow-up    HPI: Casey Hutchinson is a 57 y.o. male who presents to the office for MRI review. Patient denies any changes in symptoms.  Continues to complain mainly of right shoulder pain that he localizes to the lateral aspect of the shoulder.  No radiation of pain.  No popping.  He has a lot of difficulty with sleeping.  Tramadol is not helpful.  He is also having similar pain in his left shoulder for which he is scheduled for left shoulder MRI scan to be done next Friday.  MRI results revealed: MR SHOULDER RIGHT W CONTRAST  Result Date: 01/31/2023 CLINICAL DATA:  Right shoulder pain for 6 months. EXAM: MRI OF THE RIGHT SHOULDER WITH CONTRAST TECHNIQUE: Multiplanar, multisequence MR imaging of the right shoulder was performed following the administration of intra-articular contrast. CONTRAST:  See Injection Documentation. COMPARISON:  None Available. FINDINGS: Rotator cuff: Moderate tendinosis of the supraspinatus tendon with a high-grade partial-thickness bursal surface tear anteriorly and a small full-thickness component. Mild tendinosis of the infraspinatus tendon. Teres minor tendon is intact. Subscapularis tendon is intact. Muscles: No muscle atrophy or edema. No intramuscular fluid collection or hematoma. Biceps Long Head: Intraarticular and extraarticular portions of the biceps tendon are intact. Acromioclavicular Joint: Moderate arthropathy of the acromioclavicular joint. Small amount of contrast in the subacromial/subdeltoid bursa. Glenohumeral Joint: Intraarticular contrast distending the joint capsule. Normal glenohumeral ligaments. No chondral defect. Labrum: Superior  labral degeneration. Bones: No fracture or dislocation. No aggressive osseous lesion. Other: No fluid collection or hematoma. IMPRESSION: 1. Moderate tendinosis of the supraspinatus tendon with a high-grade partial-thickness bursal surface tear anteriorly and a small full-thickness component. 2. Mild tendinosis of the infraspinatus tendon. Electronically Signed   By: Kathreen Devoid M.D.   On: 01/31/2023 06:41                 ROS: All systems reviewed are negative as they relate to the chief complaint within the history of present illness.  Patient denies fevers or chills.  Assessment & Plan: Visit Diagnoses:  1. Complete tear of right rotator cuff, unspecified whether traumatic     Plan: Casey Hutchinson is a 57 y.o. male who presents to the office for review of right shoulder MRI scan.  Right shoulder MRI was reviewed and demonstrates small full-thickness tear of the anterior supraspinatus with a partial-thickness tear as well.  He has had several injections with 1 injection providing 2 weeks of relief and another injection providing pretty much no relief.  With failure of injections and continued pain that is making it difficult for him to sleep, suspect that he will need to have surgical intervention at some point on this shoulder but plan to hold off on scheduling surgery until he has left shoulder MRI scan done so we can decide which is the more time sensitive shoulder.  Likely has some rotator cuff pathology on the left shoulder given his increased weakness and similar pain.  Will make him an appointment with Dr. Marlou Sa to review left and right shoulder MRI scans in the week of the  26th.  Follow-Up Instructions: No follow-ups on file.   Orders:  No orders of the defined types were placed in this encounter.  No orders of the defined types were placed in this encounter.     Procedures: No procedures performed   Clinical Data: No additional findings.  Objective: Vital Signs: There were no  vitals taken for this visit.  Physical Exam:  Constitutional: Patient appears well-developed HEENT:  Head: Normocephalic Eyes:EOM are normal Neck: Normal range of motion Cardiovascular: Normal rate Pulmonary/chest: Effort normal Neurologic: Patient is alert Skin: Skin is warm Psychiatric: Patient has normal mood and affect  Ortho Exam: Ortho exam demonstrates right shoulder with 50 degrees X rotation, 110 degrees abduction, 180 degrees forward elevation.  He has 5 -/5 external rotation weakness with 5/5 supra and 5/5 subscap strength.  Actually has a little bit increased external rotation weakness on the left compared with the right.  No significant tenderness over the [facility] joint.  Does have mild to moderate tenderness over the bicipital groove.  Positive O'Brien sign.  Specialty Comments:  No specialty comments available.  Imaging: No results found.   PMFS History: Patient Active Problem List   Diagnosis Date Noted   Chronic cholecystitis with calculus 08/06/2014   Gallstones 08/04/2014   Symptomatic cholelithiasis 08/04/2014   Past Medical History:  Diagnosis Date   Cholelithiasis    GERD (gastroesophageal reflux disease)     Family History  Problem Relation Age of Onset   COPD Mother     Past Surgical History:  Procedure Laterality Date   CHOLECYSTECTOMY N/A 08/05/2014   Procedure: LAPAROSCOPIC CHOLECYSTECTOMY;  Surgeon: Rolm Bookbinder, MD;  Location: [place];  Service: General;  Laterality: N/A;   HAND SURGERY     carpel tunnel   SHOULDER SURGERY     VASECTOMY     Social History   Occupational History   Not on file  Tobacco Use   Smoking status: Never   Smokeless tobacco: Never  Substance and Sexual Activity   Alcohol use: No   Drug use: No   Sexual activity: Not on file

## 2023-02-17 ENCOUNTER — Ambulatory Visit
Admission: RE | Admit: 2023-02-17 | Discharge: 2023-02-17 | Disposition: A | Discharge status: Home / Self Care | Payer: 59 | Source: Ambulatory Visit | Attending: Orthopedic Surgery | Admitting: Orthopedic Surgery

## 2023-02-17 DIAGNOSIS — M7542 Impingement syndrome of left shoulder: Secondary | ICD-10-CM

## 2023-02-17 DIAGNOSIS — M7541 Impingement syndrome of right shoulder: Secondary | ICD-10-CM

## 2023-02-17 DIAGNOSIS — S46012A Strain of muscle(s) and tendon(s) of the rotator cuff of left shoulder, initial encounter: Secondary | ICD-10-CM | POA: Diagnosis not present

## 2023-02-17 MED ORDER — IOPAMIDOL (ISOVUE-M 200) INJECTION 41%
13.0000 mL | Freq: Once | INTRAMUSCULAR | Status: AC
  Administered 2023-02-17: 13 mL via INTRA_ARTICULAR

## 2023-02-20 ENCOUNTER — Ambulatory Visit: Payer: 59 | Admitting: Orthopedic Surgery

## 2023-02-22 ENCOUNTER — Ambulatory Visit: Payer: 59 | Admitting: Orthopedic Surgery

## 2023-02-22 ENCOUNTER — Other Ambulatory Visit: Payer: Self-pay | Admitting: Surgical

## 2023-02-22 DIAGNOSIS — M5412 Radiculopathy, cervical region: Secondary | ICD-10-CM | POA: Diagnosis not present

## 2023-02-22 MED ORDER — ACETAMINOPHEN-CODEINE 300-30 MG PO TABS: 1.0000 | ORAL_TABLET | Freq: Three times a day (TID) | ORAL | 0 refills | Status: DC | PRN

## 2023-02-23 ENCOUNTER — Telehealth: Payer: Self-pay | Admitting: Orthopedic Surgery

## 2023-02-23 ENCOUNTER — Encounter: Payer: Self-pay | Admitting: Orthopedic Surgery

## 2023-02-23 ENCOUNTER — Other Ambulatory Visit: Payer: Self-pay | Admitting: Orthopedic Surgery

## 2023-02-23 NOTE — Telephone Encounter (Signed)
Patient called in stating the Tylenol 3 that was called in yesterday Is not in [facility] and [facility] [address] [facility] can we get it switched over, please advise

## 2023-02-23 NOTE — Telephone Encounter (Signed)
[facility] [address] [facility]. I called it in for the pt. And advised pt

## 2023-02-24 ENCOUNTER — Encounter: Payer: Self-pay | Admitting: Orthopedic Surgery

## 2023-02-24 NOTE — Progress Notes (Signed)
Office Visit Note   Patient: Casey Hutchinson           Date of Birth: November 29, 1966           MRN: [id number] Visit Date: 02/22/2023 Requested by: Leonard Downing, MD [address] [place],  [place] [address] PCP: Leonard Downing, MD  Subjective: Chief Complaint  Patient presents with   Other    Scan review    HPI: Casey Hutchinson is a 57 y.o. male who presents to the office reporting bilateral shoulder pain.  Since he was last seen he had an MRI scan of the left shoulder.  That scan shows partial articular surface insertional tear of the supraspinatus tendon anteriorly with no full-thickness tendon tear.  There is mild to moderate AC joint degenerative changes with old fracture of the clavicle.  Right MRI scan showed small full-thickness anterior supraspinatus tear.  Patient is taking tramadol Advil and Mobic.  First shot helped the shoulder for about 2 weeks last shot only helped him 1 to 2 days.  The pain is radiating down the left scapula.  He has been to the chiropractor 4-5 times.  He does Architect work.  Does describe radicular type symptoms in both shoulders and arms..                ROS: All systems reviewed are negative as they relate to the chief complaint within the history of present illness.  Patient denies fevers or chills.  Assessment & Plan: Visit Diagnoses:  1. Radiculopathy, cervical region     Plan: Impression is small full-thickness rotator cuff tear on the right shoulder.  Partial-thickness rotator cuff tear in the left shoulder.  All in all the patient's relatively incapacitating symptoms may be related to this shoulder pathology but it is also possible with a scapular component of the pain that it could be coming from the neck.  Prior to any operative intervention in the shoulders I would like to make sure that there is no foraminal stenosis or central cord involvement with disc herniation or degenerative spurring which could be causing a  radicular component of his symptoms.  Tylenol 3 prescribed.  C-spine MRI of the neck pending.  Mild degenerative changes but no acute fracture present on radiographs of the cervical spine by report.  Follow-Up Instructions: No follow-ups on file.   Orders:  Orders Placed This Encounter  Procedures   MR Cervical Spine w/o contrast   No orders of the defined types were placed in this encounter.     Procedures: No procedures performed   Clinical Data: No additional findings.  Objective: Vital Signs: There were no vitals taken for this visit.  Physical Exam:  Constitutional: Patient appears well-developed HEENT:  Head: Normocephalic Eyes:EOM are normal Neck: Normal range of motion Cardiovascular: Normal rate Pulmonary/chest: Effort normal Neurologic: Patient is alert Skin: Skin is warm Psychiatric: Patient has normal mood and affect  Ortho Exam: Ortho exam demonstrates pretty reasonable cervical spine range of motion.  5 out of 5 grip EPL FPL interosseous wrist flexion extension bicep triceps and deltoid strength.  Passive range of motion of both shoulders is 50/95/165.  Pretty good rotator cuff strength infraspinatus supraspinatus and subscap muscle testing with no coarse grinding or crepitus with internal or external rotation of either arm at 90 degrees of abduction.  No discrete AC joint tenderness on either side.  No definite paresthesias C5-T1.  Specialty Comments:  No specialty comments available.  Imaging: No results  found.   PMFS History: Patient Active Problem List   Diagnosis Date Noted   Chronic cholecystitis with calculus 08/06/2014   Gallstones 08/04/2014   Symptomatic cholelithiasis 08/04/2014   Past Medical History:  Diagnosis Date   Cholelithiasis    GERD (gastroesophageal reflux disease)     Family History  Problem Relation Age of Onset   COPD Mother     Past Surgical History:  Procedure Laterality Date   CHOLECYSTECTOMY N/A 08/05/2014    Procedure: LAPAROSCOPIC CHOLECYSTECTOMY;  Surgeon: Rolm Bookbinder, MD;  Location: [place];  Service: General;  Laterality: N/A;   HAND SURGERY     carpel tunnel   SHOULDER SURGERY     VASECTOMY     Social History   Occupational History   Not on file  Tobacco Use   Smoking status: Never   Smokeless tobacco: Never  Substance and Sexual Activity   Alcohol use: No   Drug use: No   Sexual activity: Not on file

## 2023-02-27 ENCOUNTER — Encounter: Payer: Self-pay | Admitting: Orthopedic Surgery

## 2023-02-28 ENCOUNTER — Encounter: Payer: Self-pay | Admitting: Orthopedic Surgery

## 2023-03-01 ENCOUNTER — Encounter: Payer: Self-pay | Admitting: Orthopedic Surgery

## 2023-03-04 ENCOUNTER — Ambulatory Visit
Admission: RE | Admit: 2023-03-04 | Discharge: 2023-03-04 | Disposition: A | Discharge status: Home / Self Care | Payer: 59 | Source: Ambulatory Visit | Attending: Orthopedic Surgery | Admitting: Orthopedic Surgery

## 2023-03-04 DIAGNOSIS — M5412 Radiculopathy, cervical region: Secondary | ICD-10-CM

## 2023-03-04 DIAGNOSIS — M4802 Spinal stenosis, cervical region: Secondary | ICD-10-CM | POA: Diagnosis not present

## 2023-03-08 ENCOUNTER — Ambulatory Visit: Payer: 59 | Admitting: Orthopedic Surgery

## 2023-03-08 ENCOUNTER — Encounter: Payer: Self-pay | Admitting: Orthopedic Surgery

## 2023-03-08 DIAGNOSIS — M5412 Radiculopathy, cervical region: Secondary | ICD-10-CM

## 2023-03-08 NOTE — Progress Notes (Signed)
Office Visit Note   Patient: Casey Hutchinson           Date of Birth: [birth year]           MRN: [id number] Visit Date: 03/08/2023 Requested by: Casey Downing, Casey Hutchinson [address] [place],  [place] [address] PCP: Casey Downing, Casey Hutchinson  Subjective: Chief Complaint  Patient presents with   Neck - Pain    HPI: Casey Hutchinson is a 57 y.o. male who presents to the office reporting continued neck and bilateral shoulder pain.  Since he was last seen he had an MRI of the cervical spine.  Cervical spine MRI does show multilevel cervical disc degeneration with mild multifactorial spinal stenosis at C3-4 and C5-6 with mild cord mass effect but no cord signal abnormality.  Patient does also have moderate neuroforaminal stenosis on the right at C4 and bilateral C6 and C7 nerve levels.  He also has right shoulder and left shoulder rotator cuff pathology which is slightly worse on the right-hand side.  He did get 3 weeks of relief from the right shoulder subacromial injection.  He does do Architect work..                ROS: All systems reviewed are negative as they relate to the chief complaint within the history of present illness.  Patient denies fevers or chills.  Assessment & Plan: Visit Diagnoses:  1. Radiculopathy, cervical region     Plan: Impression is both cervical spine and shoulder pathology.  We reviewed the MRI scan with Casey Hutchinson.  I think the neck to be contributing to some but probably not most of his shoulder symptoms particular on the right-hand side where the rotator cuff pathology is more prominent.  He has failed conservative measures.  Plan at this time is right shoulder arthroscopy with biceps tenodesis and rotator cuff tear repair.  Time out of work from Architect on the order of 3 months.  The tear is medium size so we should be able to get a robust repair on the shoulder.  I think it would also be beneficial for Casey Hutchinson to undergo cervical spine ESI right for  diagnostic and therapeutic purposes.  Plan to see him back 1 week after his shoulder surgery.  Risk and benefits of shoulder surgery are discussed including not limited to infection or vessel damage incomplete functional restoration and incomplete pain relief.  Would also be beneficial for him to use a brace/chair for passive range of motion postoperatively.  All questions answered  Follow-Up Instructions: No follow-ups on file.   Orders:  Orders Placed This Encounter  Procedures   Ambulatory referral to Physical Medicine Rehab   No orders of the defined types were placed in this encounter.     Procedures: No procedures performed   Clinical Data: No additional findings.  Objective: Vital Signs: There were no vitals taken for this visit.  Physical Exam:  Constitutional: Patient appears well-developed HEENT:  Head: Normocephalic Eyes:EOM are normal Neck: Normal range of motion Cardiovascular: Normal rate Pulmonary/chest: Effort normal Neurologic: Patient is alert Skin: Skin is warm Psychiatric: Patient has normal mood and affect  Ortho Exam: Ortho exam demonstrates no restriction of passive external rotation of 15 degrees abduction  in either shoulder.  Patient has pretty reasonable rotator cuff strength in respect supraspinatus and subscap testing on the right left-hand side with slight weakness to supraspinatus testing more on the right than the left.  No discrete AC joint tenderness is  present.  Brein's testing is equivocal on the right negative on the left.  Minimal coarseness and grinding with internal and external rotation at 90 degrees of abduction on both shoulders.  Cervical spine range of motion intact.  No definite paresthesias C5-T1.  No masses lymphadenopathy or skin changes noted in the neck or shoulder girdle region.  Specialty Comments:  No specialty comments available.  Imaging: No results found.   PMFS History: Patient Active Problem List   Diagnosis Date  Noted   Chronic cholecystitis with calculus 08/06/2014   Gallstones 08/04/2014   Symptomatic cholelithiasis 08/04/2014   Past Medical History:  Diagnosis Date   Cholelithiasis    GERD (gastroesophageal reflux disease)     Family History  Problem Relation Age of Onset   COPD Mother     Past Surgical History:  Procedure Laterality Date   CHOLECYSTECTOMY N/A 08/05/2014   Procedure: LAPAROSCOPIC CHOLECYSTECTOMY;  Surgeon: Rolm Bookbinder, Casey Hutchinson;  Location: [place];  Service: General;  Laterality: N/A;   HAND SURGERY     carpel tunnel   SHOULDER SURGERY     VASECTOMY     Social History   Occupational History   Not on file  Tobacco Use   Smoking status: Never   Smokeless tobacco: Never  Substance and Sexual Activity   Alcohol use: No   Drug use: No   Sexual activity: Not on file

## 2023-03-09 ENCOUNTER — Telehealth: Payer: Self-pay | Admitting: Physical Medicine and Rehabilitation

## 2023-03-09 NOTE — Telephone Encounter (Signed)
Patient states that Dr Marlou Sa was supposed send a referral  to [facility] for injection in his neck. [id number]

## 2023-03-13 ENCOUNTER — Telehealth: Payer: Self-pay | Admitting: Orthopedic Surgery

## 2023-03-13 NOTE — Telephone Encounter (Signed)
Patient states that Dr Marlou Sa was going to send a referral to Dr Ernestina Patches for injections for neck.

## 2023-03-13 NOTE — Telephone Encounter (Signed)
Spoke with patient and informed him that the injection is still pending review. Advised that I will call him once it is authorized

## 2023-03-15 ENCOUNTER — Telehealth: Payer: Self-pay | Admitting: Physical Medicine and Rehabilitation

## 2023-03-15 NOTE — Telephone Encounter (Signed)
Spoke with patient's wife and scheduled injection for 03/27/23. Patient aware driver needed

## 2023-03-15 NOTE — Telephone Encounter (Signed)
Patient waiting on an injection for his neck. Patients wife asking to be called--[phone] Dyer)

## 2023-03-27 ENCOUNTER — Other Ambulatory Visit: Payer: Self-pay

## 2023-03-27 ENCOUNTER — Ambulatory Visit: Payer: 59 | Admitting: Physical Medicine and Rehabilitation

## 2023-03-27 VITALS — BP 138/77 | HR 65

## 2023-03-27 DIAGNOSIS — M5412 Radiculopathy, cervical region: Secondary | ICD-10-CM | POA: Diagnosis not present

## 2023-03-27 MED ORDER — METHYLPREDNISOLONE ACETATE 80 MG/ML IJ SUSP
80.0000 mg | Freq: Once | INTRAMUSCULAR | Status: AC
  Administered 2023-03-27: 80 mg

## 2023-03-27 NOTE — Progress Notes (Signed)
Functional Pain Scale - descriptive words and definitions  Distressing (6)    Pain is present/unable to complete most ADLs limited by pain/sleep is difficult and active distraction is only marginal. Moderate range order  Average Pain 6-7   +Driver, -BT, -Dye Allergies.  Neck pain that radiates into both shoulders and upper arms

## 2023-03-27 NOTE — Patient Instructions (Signed)

## 2023-04-01 NOTE — Progress Notes (Signed)
Casey Hutchinson - 57 y.o. male MRN [id number]  Date of birth: 03/24/1966  Office Visit Note: Visit Date: 03/27/2023 PCP: Kaleen Mask, MD Referred by: Kaleen Mask, *  Subjective: Chief Complaint  Patient presents with   Neck - Pain   HPI:  Casey Hutchinson is a 57 y.o. male who comes in today at the request of Dr. Burnard Bunting for planned Left C7-T1 Cervical Interlaminar epidural steroid injection with fluoroscopic guidance.  The patient has failed conservative care including home exercise, medications, time and activity modification.  This injection will be diagnostic and hopefully therapeutic.  Please see requesting physician notes for further details and justification.   ROS Otherwise per HPI.  Assessment & Plan: Visit Diagnoses:    ICD-10-CM   1. Radiculopathy, cervical region  M54.12 XR C-ARM NO REPORT    Epidural Steroid injection    methylPREDNISolone acetate (DEPO-MEDROL) injection 80 mg      Plan: No additional findings.   Meds & Orders:  Meds ordered this encounter  Medications   methylPREDNISolone acetate (DEPO-MEDROL) injection 80 mg    Orders Placed This Encounter  Procedures   XR C-ARM NO REPORT   Epidural Steroid injection    Follow-up: Return for visit to requesting provider as needed.   Procedures: No procedures performed  Cervical Epidural Steroid Injection - Interlaminar Approach with Fluoroscopic Guidance  Patient: Casey Hutchinson      Date of Birth: 02-May-1966 MRN: [id number] PCP: Kaleen Mask, MD      Visit Date: 03/27/2023   Universal Protocol:    Date/Time: 04/06/241:43 PM  Consent Given By: the patient  Position: PRONE  Additional Comments: Vital signs were monitored before and after the procedure. Patient was prepped and draped in the usual sterile fashion. The correct patient, procedure, and site was verified.   Injection Procedure Details:   Procedure diagnoses: Radiculopathy, cervical region  [M54.12]    Meds Administered:  Meds ordered this encounter  Medications   methylPREDNISolone acetate (DEPO-MEDROL) injection 80 mg     Laterality: Left  Location/Site: C7-T1  Needle: 3.5 in., 20 ga. Tuohy  Needle Placement: Paramedian epidural space  Findings:  -Comments: Excellent flow of contrast into the epidural space.  Procedure Details: Using a paramedian approach from the side mentioned above, the region overlying the inferior lamina was localized under fluoroscopic visualization and the soft tissues overlying this structure were infiltrated with 4 ml. of 1% Lidocaine without Epinephrine. A # 20 gauge, Tuohy needle was inserted into the epidural space using a paramedian approach.  The epidural space was localized using loss of resistance along with contralateral oblique bi-planar fluoroscopic views.  After negative aspirate for air, blood, and CSF, a 2 ml. volume of Isovue-250 was injected into the epidural space and the flow of contrast was observed. Radiographs were obtained for documentation purposes.   The injectate was administered into the level noted above.  Additional Comments:  No complications occurred Dressing: 2 x 2 sterile gauze and Band-Aid    Post-procedure details: Patient was observed during the procedure. Post-procedure instructions were reviewed.  Patient left the clinic in stable condition.   Clinical History: MRI CERVICAL SPINE WITHOUT CONTRAST   TECHNIQUE: Multiplanar, multisequence MR imaging of the cervical spine was performed. No intravenous contrast was administered.   COMPARISON:  Cervical spine radiographs 01/09/2023.   FINDINGS: Alignment: Mild straightening of lumbar lordosis similar to prior radiographs. No significant scoliosis or spondylolisthesis.   Vertebrae: No marrow edema or  evidence of acute osseous abnormality. Background bone marrow signal within normal limits.   Cord: No cervical spinal cord signal abnormality,  despite some degenerative cord mass effect detailed below. Negative visible upper thoracic spinal canal and cord.   Posterior Fossa, vertebral arteries, paraspinal tissues: Cervicomedullary junction is within normal limits. Negative visible posterior fossa. Partially empty sella visible on series 2, image 8.   Preserved major vascular flow voids in the neck with fairly codominant vertebral arteries. Negative visible neck soft tissues, lung apices.   Disc levels:   C2-C3:  Negative.   C3-C4: Disc space loss with circumferential disc bulge and endplate spurring. Broad-based posterior component. Mild spinal stenosis with mild ventral cord mass effect. Mild facet hypertrophy. Mild to moderate right C4 foraminal stenosis.   C4-C5: Mild disc bulging. Mild facet hypertrophy greater on the left. No significant stenosis.   C5-C6: Disc space loss with circumferential disc bulge. Broad-based posterior component. Mild spinal stenosis and ventral cord mass effect (series 6, image 18). Mild to moderate bilateral C6 foraminal stenosis.   C6-C7: Mild circumferential disc bulge and endplate spurring, most affecting the foramina. Mild facet hypertrophy. No spinal stenosis. Mild to moderate bilateral C7 foraminal stenosis.   C7-T1:  Mild facet hypertrophy.  No stenosis.   IMPRESSION: 1. 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 2. Up to moderate neural foraminal stenosis at the right C4, bilateral C6 and C7 nerve levels.     Electronically Signed   By: Odessa Fleming M.D.   On: 03/06/2023 09:24     Objective:  VS:  HT:    WT:   BMI:     BP:138/77  HR:65bpm  TEMP: ( )  RESP:  Physical Exam Vitals and nursing note reviewed.  Constitutional:      General: He is not in acute distress.    Appearance: Normal appearance. He is not ill-appearing.  HENT:     Head: Normocephalic  and atraumatic.     Right Ear: External ear normal.     Left Ear: External ear normal.  Eyes:     Extraocular Movements: Extraocular movements intact.  Cardiovascular:     Rate and Rhythm: Normal rate.     Pulses: Normal pulses.  Abdominal:     General: There is no distension.     Palpations: Abdomen is soft.  Musculoskeletal:        General: No signs of injury.     Cervical back: Neck supple. Tenderness present. No rigidity.     Right lower leg: No edema.     Left lower leg: No edema.     Comments: Patient has good strength in the upper extremities with 5 out of 5 strength in wrist extension long finger flexion APB.  No intrinsic hand muscle atrophy.  Negative Hoffmann's test.  Lymphadenopathy:     Cervical: No cervical adenopathy.  Skin:    Findings: No erythema or rash.  Neurological:     General: No focal deficit present.     Mental Status: He is alert and oriented to person, place, and time.     Sensory: No sensory deficit.     Motor: No weakness or abnormal muscle tone.     Coordination: Coordination normal.  Psychiatric:        Mood and Affect: Mood normal.        Behavior: Behavior normal.      Imaging: No results  found.

## 2023-04-01 NOTE — Procedures (Signed)
Cervical Epidural Steroid Injection - Interlaminar Approach with Fluoroscopic Guidance  Patient: Casey Hutchinson      Date of Birth: 1966-03-16 MRN: [id number] PCP: Kaleen Mask, MD      Visit Date: 03/27/2023   Universal Protocol:    Date/Time: 04/06/241:43 PM  Consent Given By: the patient  Position: PRONE  Additional Comments: Vital signs were monitored before and after the procedure. Patient was prepped and draped in the usual sterile fashion. The correct patient, procedure, and site was verified.   Injection Procedure Details:   Procedure diagnoses: Radiculopathy, cervical region [M54.12]    Meds Administered:  Meds ordered this encounter  Medications   methylPREDNISolone acetate (DEPO-MEDROL) injection 80 mg     Laterality: Left  Location/Site: C7-T1  Needle: 3.5 in., 20 ga. Tuohy  Needle Placement: Paramedian epidural space  Findings:  -Comments: Excellent flow of contrast into the epidural space.  Procedure Details: Using a paramedian approach from the side mentioned above, the region overlying the inferior lamina was localized under fluoroscopic visualization and the soft tissues overlying this structure were infiltrated with 4 ml. of 1% Lidocaine without Epinephrine. A # 20 gauge, Tuohy needle was inserted into the epidural space using a paramedian approach.  The epidural space was localized using loss of resistance along with contralateral oblique bi-planar fluoroscopic views.  After negative aspirate for air, blood, and CSF, a 2 ml. volume of Isovue-250 was injected into the epidural space and the flow of contrast was observed. Radiographs were obtained for documentation purposes.   The injectate was administered into the level noted above.  Additional Comments:  No complications occurred Dressing: 2 x 2 sterile gauze and Band-Aid    Post-procedure details: Patient was observed during the procedure. Post-procedure instructions were  reviewed.  Patient left the clinic in stable condition.

## 2023-04-03 ENCOUNTER — Encounter: Payer: Self-pay | Admitting: Orthopedic Surgery

## 2023-04-03 ENCOUNTER — Other Ambulatory Visit: Payer: Self-pay | Admitting: Surgical

## 2023-04-03 DIAGNOSIS — Y999 Unspecified external cause status: Secondary | ICD-10-CM | POA: Diagnosis not present

## 2023-04-03 DIAGNOSIS — M7521 Bicipital tendinitis, right shoulder: Secondary | ICD-10-CM

## 2023-04-03 DIAGNOSIS — M75121 Complete rotator cuff tear or rupture of right shoulder, not specified as traumatic: Secondary | ICD-10-CM

## 2023-04-03 DIAGNOSIS — S43431A Superior glenoid labrum lesion of right shoulder, initial encounter: Secondary | ICD-10-CM | POA: Diagnosis not present

## 2023-04-03 DIAGNOSIS — G8918 Other acute postprocedural pain: Secondary | ICD-10-CM | POA: Diagnosis not present

## 2023-04-03 DIAGNOSIS — M75111 Incomplete rotator cuff tear or rupture of right shoulder, not specified as traumatic: Secondary | ICD-10-CM | POA: Diagnosis not present

## 2023-04-03 DIAGNOSIS — X58XXXA Exposure to other specified factors, initial encounter: Secondary | ICD-10-CM | POA: Diagnosis not present

## 2023-04-03 DIAGNOSIS — S43431D Superior glenoid labrum lesion of right shoulder, subsequent encounter: Secondary | ICD-10-CM

## 2023-04-03 MED ORDER — METHOCARBAMOL 500 MG PO TABS: 500.0000 mg | ORAL_TABLET | Freq: Three times a day (TID) | ORAL | 1 refills | Status: DC | PRN

## 2023-04-03 MED ORDER — OXYCODONE HCL 5 MG PO TABS: 5.0000 mg | ORAL_TABLET | ORAL | 0 refills | Status: DC | PRN

## 2023-04-03 MED ORDER — ONDANSETRON HCL 4 MG PO TABS: 4.0000 mg | ORAL_TABLET | Freq: Three times a day (TID) | ORAL | 0 refills | Status: DC | PRN

## 2023-04-03 MED ORDER — CELECOXIB 100 MG PO CAPS: 100.0000 mg | ORAL_CAPSULE | Freq: Two times a day (BID) | ORAL | 0 refills | Status: DC

## 2023-04-07 ENCOUNTER — Telehealth: Payer: Self-pay | Admitting: Orthopedic Surgery

## 2023-04-07 ENCOUNTER — Other Ambulatory Visit: Payer: Self-pay | Admitting: Surgical

## 2023-04-07 ENCOUNTER — Telehealth: Payer: Self-pay

## 2023-04-07 MED ORDER — OXYCODONE HCL 5 MG PO TABS: 5.0000 mg | ORAL_TABLET | ORAL | 0 refills | Status: DC | PRN

## 2023-04-07 NOTE — Telephone Encounter (Signed)
Sent in refill.  Called and left VM

## 2023-04-07 NOTE — Telephone Encounter (Signed)
Patient asking for a refill on his oxycodone please advise/ [facility] [address]. Please advise patient. Wife also states he is still in a lot of pain transferring call to Triage.

## 2023-04-07 NOTE — Telephone Encounter (Signed)
Duplicate-see other note-waiting to be advised by Franky Macho

## 2023-04-12 ENCOUNTER — Ambulatory Visit (INDEPENDENT_AMBULATORY_CARE_PROVIDER_SITE_OTHER): Payer: 59 | Admitting: Surgical

## 2023-04-12 ENCOUNTER — Encounter: Payer: Self-pay | Admitting: Surgical

## 2023-04-12 DIAGNOSIS — Z9889 Other specified postprocedural states: Secondary | ICD-10-CM

## 2023-04-12 MED ORDER — ACETAMINOPHEN-CODEINE 300-30 MG PO TABS: 1.0000 | ORAL_TABLET | Freq: Four times a day (QID) | ORAL | 0 refills | Status: DC | PRN

## 2023-04-12 NOTE — Progress Notes (Signed)
   Post-Op Visit Note   Patient: Casey Hutchinson           Date of Birth: Apr 26, 1966           MRN: [id number] Visit Date: 04/12/2023 PCP: Kaleen Mask, MD   Assessment & Plan:  Chief Complaint:  Chief Complaint  Patient presents with   Right Shoulder - Routine Post Op   Visit Diagnoses:  1. S/P rotator cuff repair     Plan: Casey Hutchinson is a 57 y.o. male who presents s/p right shoulder rotator cuff repair and biceps tenodesis on 04/03/2023.  Patient is doing well and pain is overall controlled.  Using CPM machine 3x/day.  Denies any chest pain, SOB, fevers, chills. Taking pain medication every 4 hours initially but oxycodone was causing consistent nausea even with Zofran so he would like to just take Tylenol with codeine instead.  This will be prescribed..    On exam, patient has range of motion 15 degrees external rotation, 60 degrees abduction, 90 degrees forward elevation passively.  Intact EPL, FPL, finger abduction, finger adduction, pronation/supination, bicep, tricep, deltoid of operative extremity.  Axillary nerve intact with deltoid firing.  Incisions are healing well without evidence of infection or dehiscence.  Sutures removed and replaced with Steri-Strips today.  2+ radial pulse of the operative extremity  Plan is continue with passive range of motion with CPM machine.  No active range of motion of the shoulder or any lifting with the operative arm.  Stay in sling when not using CPM machine.  Okay to shower.  Follow-up for clinical recheck with Dr. August Saucer in 2 weeks and initiation of physical therapy at that point..   Follow-Up Instructions: No follow-ups on file.   Orders:  No orders of the defined types were placed in this encounter.  No orders of the defined types were placed in this encounter.   Imaging: No results found.  PMFS History: Patient Active Problem List   Diagnosis Date Noted   Chronic cholecystitis with calculus 08/06/2014   Gallstones  08/04/2014   Symptomatic cholelithiasis 08/04/2014   Past Medical History:  Diagnosis Date   Cholelithiasis    GERD (gastroesophageal reflux disease)     Family History  Problem Relation Age of Onset   COPD Mother     Past Surgical History:  Procedure Laterality Date   CHOLECYSTECTOMY N/A 08/05/2014   Procedure: LAPAROSCOPIC CHOLECYSTECTOMY;  Surgeon: Emelia Loron, MD;  Location: [facility] OR;  Service: General;  Laterality: N/A;   HAND SURGERY     carpel tunnel   SHOULDER SURGERY     VASECTOMY     Social History   Occupational History   Not on file  Tobacco Use   Smoking status: Never   Smokeless tobacco: Never  Substance and Sexual Activity   Alcohol use: No   Drug use: No   Sexual activity: Not on file

## 2023-04-13 ENCOUNTER — Other Ambulatory Visit: Payer: Self-pay | Admitting: Surgical

## 2023-04-13 ENCOUNTER — Telehealth: Payer: Self-pay

## 2023-04-13 MED ORDER — HYDROCODONE-ACETAMINOPHEN 5-325 MG PO TABS: 1.0000 | ORAL_TABLET | Freq: Four times a day (QID) | ORAL | 0 refills | Status: DC | PRN

## 2023-04-13 NOTE — Telephone Encounter (Signed)
Received fax from pharmacy that T#3 not available at [facility] requesting alternative due to product being on back order.

## 2023-04-13 NOTE — Telephone Encounter (Signed)
Sent in prescription for Norco instead of Tylenol 3.  If he cannot take this let me know.

## 2023-04-30 ENCOUNTER — Other Ambulatory Visit: Payer: Self-pay | Admitting: Surgical

## 2023-05-03 ENCOUNTER — Ambulatory Visit (INDEPENDENT_AMBULATORY_CARE_PROVIDER_SITE_OTHER): Payer: 59 | Admitting: Orthopedic Surgery

## 2023-05-03 ENCOUNTER — Encounter: Payer: Self-pay | Admitting: Orthopedic Surgery

## 2023-05-03 DIAGNOSIS — Z9889 Other specified postprocedural states: Secondary | ICD-10-CM

## 2023-05-03 NOTE — Progress Notes (Signed)
   Post-Op Visit Note   Patient: Casey Hutchinson           Date of Birth: [birth year]           MRN: [id number] Visit Date: 05/03/2023 PCP: Kaleen Mask, MD   Assessment & Plan:  Chief Complaint:  Chief Complaint  Patient presents with   Right Shoulder - Routine Post Op    right shoulder rotator cuff repair and biceps tenodesis on 04/03/2023   Visit Diagnoses:  1. S/P rotator cuff repair     Plan: Patient presents now about a month out right shoulder arthroscopy with biceps tenodesis.  Rotator cuff tear also performed.  On exam he has improving shoulder range of motion with no coarse grinding or crepitus with palpation.  Would like for him to start physical therapy upstairs next week for passive range of motion for 2 weeks and then active assisted range of motion and rotator cuff strengthening to start 6 weeks status post his original surgery date.  6-week recheck for clinical evaluation.  Is having little bit of soreness anteriorly but overall his shoulder is moving well.  Cautioned him not to lift anything out in front of his body.  Okay to discontinue the sling.  Neck injection did help some with his symptoms as well.  Follow-Up Instructions: Return in about 6 weeks (around 06/14/2023).   Orders:  Orders Placed This Encounter  Procedures   Ambulatory referral to Physical Therapy   No orders of the defined types were placed in this encounter.   Imaging: No results found.  PMFS History: Patient Active Problem List   Diagnosis Date Noted   Chronic cholecystitis with calculus 08/06/2014   Gallstones 08/04/2014   Symptomatic cholelithiasis 08/04/2014   Past Medical History:  Diagnosis Date   Cholelithiasis    GERD (gastroesophageal reflux disease)     Family History  Problem Relation Age of Onset   COPD Mother     Past Surgical History:  Procedure Laterality Date   CHOLECYSTECTOMY N/A 08/05/2014   Procedure: LAPAROSCOPIC CHOLECYSTECTOMY;  Surgeon: Emelia Loron, MD;  Location: [facility] OR;  Service: General;  Laterality: N/A;   HAND SURGERY     carpel tunnel   SHOULDER SURGERY     VASECTOMY     Social History   Occupational History   Not on file  Tobacco Use   Smoking status: Never   Smokeless tobacco: Never  Substance and Sexual Activity   Alcohol use: No   Drug use: No   Sexual activity: Not on file

## 2023-05-04 ENCOUNTER — Telehealth: Payer: Self-pay

## 2023-05-04 NOTE — Telephone Encounter (Signed)
-----   Message from Cammy Copa, MD sent at 05/03/2023  9:16 PM EDT ----- Cresenciano Lick can you have him follow-up with [facility] in 6 weeks.  Thanks

## 2023-05-04 NOTE — Therapy (Signed)
OUTPATIENT PHYSICAL THERAPY EVALUATION   Patient Name: Casey Hutchinson MRN: [id number] DOB:04/24/1966, 57 y.o., male Today's Date: 05/08/2023  END OF SESSION:  PT End of Session - 05/08/23 0759     Visit Number 1    Number of Visits 20    Date for PT Re-Evaluation 07/17/23    Authorization Type [employer] $20 copay 30 visits    Authorization - Visit Number 1    Authorization - Number of Visits 30    Progress Note Due on Visit 10    PT Start Time 0802    PT Stop Time 0831    PT Time Calculation (min) 29 min    Activity Tolerance Patient tolerated treatment well    Behavior During Therapy WFL for tasks assessed/performed             Past Medical History:  Diagnosis Date   Cholelithiasis    GERD (gastroesophageal reflux disease)    Past Surgical History:  Procedure Laterality Date   CHOLECYSTECTOMY N/A 08/05/2014   Procedure: LAPAROSCOPIC CHOLECYSTECTOMY;  Surgeon: Emelia Loron, MD;  Location: [facility] OR;  Service: General;  Laterality: N/A;   HAND SURGERY     carpel tunnel   SHOULDER SURGERY     VASECTOMY     Patient Active Problem List   Diagnosis Date Noted   Chronic cholecystitis with calculus 08/06/2014   Gallstones 08/04/2014   Symptomatic cholelithiasis 08/04/2014    PCP: Casey Mask MD  REFERRING PROVIDER: Cammy Copa, MD  REFERRING DIAG: [phone] (ICD-10-CM) - S/P rotator cuff repair  THERAPY DIAG:  Chronic right shoulder pain  Muscle weakness (generalized)  Localized edema  Rationale for Evaluation and Treatment: Rehabilitation  ONSET DATE: Surgery 04/03/2023  SUBJECTIVE:                                                                                                                                                                                      SUBJECTIVE STATEMENT: Pt presented s/p recent Rt rotator cuff repair with bicep tenodesis on 04/03/2023.    Reported most pain at night, able to sleep in bed now.   Pt indicated having  general pulling feeling Rt shoulder.  Out of sling for about a week.   Works in [employer].    PERTINENT HISTORY: Lt shoulder pains concurrent.   PAIN:  NPRS scale: at worst 4-5/10 , at current 0/10.  Pain location: Rt shoulder  Pain description: pulling, grabbing Aggravating factors: movement, nighttime Relieving factors: nothing specific noted.   PRECAUTIONS: Shoulder FROM MD referral:  PROM x 2 weeks then AAROM and RC Strengthening  WEIGHT BEARING RESTRICTIONS: Yes arm of surgery  FALLS:  Has patient fallen in last 6 months? 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   OBJECTIVE:   PATIENT SURVEYS:  05/08/2023 FOTO intake:  53   predicted:  70  COGNITION: 05/08/2023 Overall cognitive status: WFL     SENSATION: 05/08/2023 WFL  POSTURE: 05/08/2023 Mild rounded shoulders bilaterally  UPPER EXTREMITY ROM:   ROM Right 05/08/2023 PROM  Left 05/08/2023  Shoulder flexion 130 in supine   Shoulder extension    Shoulder abduction 105 in supine    Shoulder adduction    Shoulder internal rotation 65 in 45 deg abduction   Shoulder external rotation 38  in 45 deg abduction   Elbow flexion    Elbow extension    Wrist flexion    Wrist extension    Wrist ulnar deviation    Wrist radial deviation    Wrist pronation    Wrist supination    (Blank rows = not tested)  UPPER EXTREMITY MMT:  MMT Right 05/08/2023 No testing due to surgery protocol  Left 05/08/2023  Shoulder flexion  5/5  Shoulder extension    Shoulder abduction  5/5  Shoulder adduction    Shoulder internal rotation  5/5  Shoulder external rotation  4/5 c pain  Middle trapezius    Lower trapezius    Elbow flexion    Elbow extension    Wrist flexion    Wrist extension    Wrist ulnar deviation    Wrist radial deviation    Wrist  pronation    Wrist supination    Grip strength (lbs)    (Blank rows = not tested)  SHOULDER SPECIAL TESTS: 05/08/2023 No specific testing today  JOINT MOBILITY TESTING:  05/08/2023 WFL movement in Rt GH joint in mid ranges all directions.   PALPATION:  05/08/2023 No specific tenderness to light touch   TODAY'S TREATMENT:                                                                        DATE:05/08/2023 Therex:    HEP instruction/performance c cues for techniques, handout provided.  Trial set performed of each for comprehension and symptom assessment.  See below for exercise list  Manual G2 inferior joint mobs Rt GH joint in flexion, scaption.    PATIENT EDUCATION: 05/08/2023 Education details: HEP, POC Person educated: Patient Education method: Programmer, multimedia, Demonstration, Verbal cues, and Handouts Education comprehension: verbalized understanding, returned demonstration, and verbal cues required  HOME EXERCISE PROGRAM: Access Code: [email or web address] URL: [email or web address] Date: 05/08/2023 Prepared by: Chyrel Masson  Exercises - Supine Shoulder Flexion PROM (Mirrored)  - 2-3 x daily - 7 x weekly - 1 sets - 10 reps - 5 hold - Standing Circular Shoulder Pendulum Supported with Arm Bent  - 2-3 x daily - 7 x weekly - 1 sets - 10 reps - Standing Flexion Extension Shoulder Pendulum Supported with Arm Bent  - 2-3 x daily - 7 x weekly - 1 sets - 10 reps - Seated Scapular Retraction  - 3-5 x daily - 7 x weekly - 1 sets - 10 reps -  3-5 hold  ASSESSMENT:  CLINICAL IMPRESSION: Patient is a 57 y.o. who comes to clinic with complaints of Rt shoulder pain s/p recent rotator cuff repair with bicep tenodesis with mobility, strength and movement coordination deficits that impair their ability to perform usual daily and recreational functional activities without increase difficulty/symptoms at this time.  Patient to benefit from skilled PT services to address impairments and  limitations to improve to previous level of function without restriction secondary to condition.   OBJECTIVE IMPAIRMENTS: decreased activity tolerance, decreased coordination, decreased endurance, decreased mobility, decreased ROM, decreased strength, increased edema, impaired perceived functional ability, impaired flexibility, impaired UE functional use, improper body mechanics, and pain.   ACTIVITY LIMITATIONS: carrying, lifting, sleeping, bathing, toileting, dressing, and reach over head  PARTICIPATION LIMITATIONS: meal prep, cleaning, laundry, interpersonal relationship, driving, shopping, community activity, and yard work  PERSONAL FACTORS: no specific factors are also affecting patient's functional outcome.   REHAB POTENTIAL: Good  CLINICAL DECISION MAKING: Stable/uncomplicated  EVALUATION COMPLEXITY: Low   GOALS: Goals reviewed with patient? Yes  SHORT TERM GOALS: (target date for Short term goals are 3 weeks 05/29/2023)  1.Patient will demonstrate independent use of home exercise program to maintain progress from in clinic treatments. Goal status: New  LONG TERM GOALS: (target dates for all long term goals are 10 weeks  07/17/2023 )   1. Patient will demonstrate/report pain at worst less than or equal to 2/10 to facilitate minimal limitation in daily activity secondary to pain symptoms. Goal status: New   2. Patient will demonstrate independent use of home exercise program to facilitate ability to maintain/progress functional gains from skilled physical therapy services. Goal status: New   3. Patient will demonstrate FOTO outcome > or = 70 % to indicate reduced disability due to condition. Goal status: New   4.  Patient will demonstrate Rt UE MMT 5/5 throughout to facilitate lifting, reaching, carrying at [facility] in daily activity.   Goal status: New   5.  Patient will demonstrate Rt GH joint AROM WFL s symptoms to facilitate usual overhead reaching, self care, dressing at  PLOF.    Goal status: New   6.  Patient will demonstrate/report ability to sleep s restriction due to symptoms.  Goal status: New   7.  Patient will demonstrate/report ability to return to work.  Goal Status: New  PLAN:  PT FREQUENCY: 1-2x/week  PT DURATION: 10 weeks  PLANNED INTERVENTIONS: Therapeutic exercises, Therapeutic activity, Neuro Muscular re-education, Balance training, Gait training, Patient/Family education, Joint mobilization, Stair training, DME instructions, Dry Needling, Electrical stimulation, Traction, Cryotherapy, vasopneumatic device Moist heat, Taping, Ultrasound, Ionotophoresis 4mg /ml Dexamethasone, and aquatic therapy ,Manual therapy.  All included unless contraindicated  PLAN FOR NEXT SESSION: Review HEP knowledge/results.   Able to progress to early AAROM/AROM activity as tolerated after 05/17/2023    MD referral on 05/03/2023:  PROM x 2 weeks then AAROM and RC Strengthening on 05/17/2023 or after  Chyrel Masson, PT, DPT, OCS, ATC 05/08/23  8:36 AM

## 2023-05-04 NOTE — Telephone Encounter (Signed)
Pls see note from Dr Dean 

## 2023-05-08 ENCOUNTER — Other Ambulatory Visit: Payer: Self-pay

## 2023-05-08 ENCOUNTER — Ambulatory Visit: Payer: 59 | Admitting: Rehabilitative and Restorative Service Providers"

## 2023-05-08 ENCOUNTER — Encounter: Payer: Self-pay | Admitting: Rehabilitative and Restorative Service Providers"

## 2023-05-08 DIAGNOSIS — R600 Localized edema: Secondary | ICD-10-CM

## 2023-05-08 DIAGNOSIS — M25511 Pain in right shoulder: Secondary | ICD-10-CM

## 2023-05-08 DIAGNOSIS — M6281 Muscle weakness (generalized): Secondary | ICD-10-CM | POA: Diagnosis not present

## 2023-05-08 DIAGNOSIS — G8929 Other chronic pain: Secondary | ICD-10-CM

## 2023-05-24 ENCOUNTER — Encounter: Payer: Self-pay | Admitting: Physical Therapy

## 2023-05-24 ENCOUNTER — Ambulatory Visit: Payer: 59 | Admitting: Physical Therapy

## 2023-05-24 DIAGNOSIS — M6281 Muscle weakness (generalized): Secondary | ICD-10-CM

## 2023-05-24 DIAGNOSIS — M25511 Pain in right shoulder: Secondary | ICD-10-CM | POA: Diagnosis not present

## 2023-05-24 DIAGNOSIS — R600 Localized edema: Secondary | ICD-10-CM | POA: Diagnosis not present

## 2023-05-24 DIAGNOSIS — G8929 Other chronic pain: Secondary | ICD-10-CM

## 2023-05-24 NOTE — Therapy (Signed)
OUTPATIENT PHYSICAL THERAPY TREATMENT   Patient Name: Casey Hutchinson MRN: [id number] DOB:1966-04-11, 56 y.o., male Today's Date: 05/24/2023  END OF SESSION:  PT End of Session - 05/24/23 0933     Visit Number 2    Number of Visits 20    Date for PT Re-Evaluation 07/17/23    Authorization Type [employer] $20 copay 30 visits    Authorization - Number of Visits 30    Progress Note Due on Visit 10    PT Start Time 0923    PT Stop Time 1001    PT Time Calculation (min) 38 min    Activity Tolerance Patient tolerated treatment well    Behavior During Therapy WFL for tasks assessed/performed             Past Medical History:  Diagnosis Date   Cholelithiasis    GERD (gastroesophageal reflux disease)    Past Surgical History:  Procedure Laterality Date   CHOLECYSTECTOMY N/A 08/05/2014   Procedure: LAPAROSCOPIC CHOLECYSTECTOMY;  Surgeon: Emelia Loron, MD;  Location: [facility] OR;  Service: General;  Laterality: N/A;   HAND SURGERY     carpel tunnel   SHOULDER SURGERY     VASECTOMY     Patient Active Problem List   Diagnosis Date Noted   Chronic cholecystitis with calculus 08/06/2014   Gallstones 08/04/2014   Symptomatic cholelithiasis 08/04/2014    PCP: Kaleen Mask MD  REFERRING PROVIDER: Cammy Copa, MD  REFERRING DIAG: [phone] (ICD-10-CM) - S/P rotator cuff repair  THERAPY DIAG:  Chronic right shoulder pain  Muscle weakness (generalized)  Localized edema  Rationale for Evaluation and Treatment: Rehabilitation  ONSET DATE: Surgery 04/03/2023  SUBJECTIVE:                                                                                                                                                                                      SUBJECTIVE STATEMENT: He relays he just came back from vacation, his shoulder feels pretty good, has been doing the exercises. He feels he is ready to begin some strengthening and he will return to work today. He works  in [employer].    PERTINENT HISTORY: Lt shoulder pains concurrent.   PAIN:  NPRS scale: current 0/10.  Pain location: Rt shoulder  Pain description: pulling, grabbing Aggravating factors: movement, nighttime Relieving factors: nothing specific noted.   PRECAUTIONS: Shoulder FROM MD referral:  PROM x 2 weeks then AAROM and RC Strengthening  WEIGHT BEARING RESTRICTIONS: Yes arm of surgery  FALLS:  Has patient fallen in last 6 months? 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   PLOF: Independent, drag  racing (working on car).  Rt hand dominant  PATIENT GOALS:  Reduce pain, get back to activity.   OBJECTIVE:   PATIENT SURVEYS:  05/08/2023 FOTO intake:  53   predicted:  70  COGNITION: 05/08/2023 Overall cognitive status: WFL     SENSATION: 05/08/2023 WFL  POSTURE: 05/08/2023 Mild rounded shoulders bilaterally  UPPER EXTREMITY ROM:   ROM Right 05/08/2023 PROM  Right 05/24/2023 AROM  Shoulder flexion 130 in supine 155 AROM in standing  Shoulder extension    Shoulder abduction 105 in supine  155 AROM in standing  Shoulder adduction    Shoulder internal rotation 65 in 45 deg abduction WFL reaching behind back in standing  Shoulder external rotation 38  in 45 deg abduction Wfl reaching behind head in standing  Elbow flexion    Elbow extension    Wrist flexion    Wrist extension    Wrist ulnar deviation    Wrist radial deviation    Wrist pronation    Wrist supination    (Blank rows = not tested)  UPPER EXTREMITY MMT:  MMT Right 05/08/2023 No testing due to surgery protocol  Left 05/08/2023 Right 05/24/23  Shoulder flexion  5/5 4+  Shoulder extension     Shoulder abduction  5/5 4  Shoulder adduction     Shoulder internal rotation  5/5 5  Shoulder external rotation  4/5 c pain 4  Middle trapezius     Lower trapezius     Elbow flexion     Elbow extension     Wrist flexion     Wrist extension     Wrist  ulnar deviation     Wrist radial deviation     Wrist pronation     Wrist supination     Grip strength (lbs)     (Blank rows = not tested)  SHOULDER SPECIAL TESTS: 05/08/2023 No specific testing today  JOINT MOBILITY TESTING:  05/08/2023 WFL movement in Rt GH joint in mid ranges all directions.   PALPATION:  05/08/2023 No specific tenderness to light touch   TODAY'S TREATMENT:                                                                         DATE:05/24/2023 Therex Supine shoulder flexion with stretch at end range 5 sec X 10 Sidelying shoulder abduction with stretch at end range 5 sec X 10 Sideling shoulder ER X 2 reps (was easy) progressed to 2# X 10 reps Standing shoulder rows blue X 15 Standing shoulder extensions blue X 15 Standing chest press blue X 15 Standing shoulder IR with towel green X 15 Standing shoulder ER with towel green X 15 Standing shoulder flexion with green X 10 Standing shoulder abd with green X 10 Updated HEP to include strengthening   DATE:05/08/2023 Therex:    HEP instruction/performance c cues for techniques, handout provided.  Trial set performed of each for comprehension and symptom assessment.  See below for exercise list  Manual G2 inferior joint mobs Rt GH joint in flexion, scaption.    PATIENT EDUCATION: 05/08/2023 Education details: HEP, POC Person educated: Patient Education method: Explanation, Demonstration, Verbal cues, and Handouts Education comprehension: verbalized understanding, returned demonstration, and verbal cues required  HOME EXERCISE  PROGRAM: Access Code: [email or web address] URL: [email or web address] Date: 05/24/2023 Prepared by: Ivery Quale  Exercises - Circular Shoulder Pendulum with Table Support  - 2 x daily - 6 x weekly - 1 sets - 20 reps - Supine Shoulder Flexion Extension Full Range AROM  - 2 x daily - 6 x weekly - 1 sets - 10-15 reps - Sidelying Shoulder Abduction Palm Forward  - 2 x daily - 6 x  weekly - 1 sets - 10-15 reps - Sidelying Shoulder External Rotation  - 2 x daily - 6 x weekly - 1 sets - 10-15 reps - Standing Shoulder Row with Anchored Resistance  - 2 x daily - 6 x weekly - 1-2 sets - 15 reps - Shoulder extension with resistance - Neutral  - 2 x daily - 6 x weekly - 1-2 sets - 15 reps - Shoulder External Rotation with Anchored Resistance  - 2 x daily - 6 x weekly - 1-2 sets - 15 reps - Shoulder Internal Rotation with Resistance  - 2 x daily - 6 x weekly - 1-2 sets - 15 reps - Standing Shoulder Flexion with Resistance  - 2 x daily - 6 x weekly - 1-2 sets - 10 reps - Standing Single Arm Shoulder Abduction with Resistance (Mirrored)  - 2 x daily - 6 x weekly - 1-2 sets - 10 reps  ASSESSMENT:  CLINICAL IMPRESSION: He is now over 7 weeks post op and shows good Rt shoulder ROM and strength up to this point without pain. He can raise his arm against gravity without shrug sign so is ready to begin light strengthening work with his Rt shoulder. I did progress his HEP to add in these strengthening and cautioned him against pushing too hard right away with them and to let the pain and soreness guide how much he is doing. He did ask if he can reduce to one time per week now that he is returning to work and I feel he can reduce to one time per week due to his current progress.   OBJECTIVE IMPAIRMENTS: decreased activity tolerance, decreased coordination, decreased endurance, decreased mobility, decreased ROM, decreased strength, increased edema, impaired perceived functional ability, impaired flexibility, impaired UE functional use, improper body mechanics, and pain.   ACTIVITY LIMITATIONS: carrying, lifting, sleeping, bathing, toileting, dressing, and reach over head  PARTICIPATION LIMITATIONS: meal prep, cleaning, laundry, interpersonal relationship, driving, shopping, community activity, and yard work  PERSONAL FACTORS: no specific factors are also affecting patient's functional  outcome.   REHAB POTENTIAL: Good  CLINICAL DECISION MAKING: Stable/uncomplicated  EVALUATION COMPLEXITY: Low   GOALS: Goals reviewed with patient? Yes  SHORT TERM GOALS: (target date for Short term goals are 3 weeks 05/29/2023)  1.Patient will demonstrate independent use of home exercise program to maintain progress from in clinic treatments. Goal status: ongoing, made updates 05/24/23  LONG TERM GOALS: (target dates for all long term goals are 10 weeks  07/17/2023 )   1. Patient will demonstrate/report pain at worst less than or equal to 2/10 to facilitate minimal limitation in daily activity secondary to pain symptoms. Goal status: New   2. Patient will demonstrate independent use of home exercise program to facilitate ability to maintain/progress functional gains from skilled physical therapy services. Goal status: New   3. Patient will demonstrate FOTO outcome > or = 70 % to indicate reduced disability due to condition. Goal status: New   4.  Patient will demonstrate Rt UE MMT 5/5 throughout to facilitate lifting, reaching, carrying  at [facility] in daily activity.   Goal status: New   5.  Patient will demonstrate Rt GH joint AROM WFL s symptoms to facilitate usual overhead reaching, self care, dressing at PLOF.    Goal status: New   6.  Patient will demonstrate/report ability to sleep s restriction due to symptoms.  Goal status: New   7.  Patient will demonstrate/report ability to return to work.  Goal Status: New  PLAN:  PT FREQUENCY: 1-2x/week  PT DURATION: 10 weeks  PLANNED INTERVENTIONS: Therapeutic exercises, Therapeutic activity, Neuro Muscular re-education, Balance training, Gait training, Patient/Family education, Joint mobilization, Stair training, DME instructions, Dry Needling, Electrical stimulation, Traction, Cryotherapy, vasopneumatic device Moist heat, Taping, Ultrasound, Ionotophoresis 4mg /ml Dexamethasone, and aquatic therapy ,Manual therapy.  All included  unless contraindicated  PLAN FOR NEXT SESSION: how is he doing with the new strengthening exercises and progress as tolerated based on symptoms.   MD referral on 05/03/2023:  PROM x 2 weeks then AAROM and RC Strengthening on 05/17/2023 or after  Ivery Quale, PT, DPT 05/24/23 10:06 AM

## 2023-05-30 ENCOUNTER — Encounter: Payer: 59 | Admitting: Rehabilitative and Restorative Service Providers"

## 2023-06-01 ENCOUNTER — Encounter: Payer: 59 | Admitting: Rehabilitative and Restorative Service Providers"

## 2023-06-01 ENCOUNTER — Other Ambulatory Visit: Payer: Self-pay | Admitting: Surgical

## 2023-06-06 ENCOUNTER — Encounter: Payer: 59 | Admitting: Rehabilitative and Restorative Service Providers"

## 2023-06-08 ENCOUNTER — Ambulatory Visit (INDEPENDENT_AMBULATORY_CARE_PROVIDER_SITE_OTHER): Payer: 59 | Admitting: Rehabilitative and Restorative Service Providers"

## 2023-06-08 ENCOUNTER — Encounter: Payer: Self-pay | Admitting: Rehabilitative and Restorative Service Providers"

## 2023-06-08 DIAGNOSIS — M6281 Muscle weakness (generalized): Secondary | ICD-10-CM

## 2023-06-08 DIAGNOSIS — R600 Localized edema: Secondary | ICD-10-CM

## 2023-06-08 DIAGNOSIS — M25511 Pain in right shoulder: Secondary | ICD-10-CM

## 2023-06-08 DIAGNOSIS — G8929 Other chronic pain: Secondary | ICD-10-CM

## 2023-06-08 NOTE — Therapy (Addendum)
OUTPATIENT PHYSICAL THERAPY TREATMENT /PROGRESS NOTE  / DISCHARGE   Patient Name: Casey Hutchinson MRN: [id number] DOB:Jun 16, 1966, 57 y.o., male Today's Date: 06/08/2023  Progress Note Reporting Period 05/08/2023 to 06/08/2023  See note below for Objective Data and Assessment of Progress/Goals.      END OF SESSION:  PT End of Session - 06/08/23 0834     Visit Number 3    Number of Visits 20    Date for PT Re-Evaluation 07/17/23    Authorization Type [employer] $20 copay 30 visits    Authorization - Visit Number 3    Authorization - Number of Visits 30    Progress Note Due on Visit 10    PT Start Time 0837    PT Stop Time 0916    PT Time Calculation (min) 39 min    Activity Tolerance Patient tolerated treatment well    Behavior During Therapy WFL for tasks assessed/performed              Past Medical History:  Diagnosis Date   Cholelithiasis    GERD (gastroesophageal reflux disease)    Past Surgical History:  Procedure Laterality Date   CHOLECYSTECTOMY N/A 08/05/2014   Procedure: LAPAROSCOPIC CHOLECYSTECTOMY;  Surgeon: Emelia Loron, MD;  Location: [facility] OR;  Service: General;  Laterality: N/A;   HAND SURGERY     carpel tunnel   SHOULDER SURGERY     VASECTOMY     Patient Active Problem List   Diagnosis Date Noted   Chronic cholecystitis with calculus 08/06/2014   Gallstones 08/04/2014   Symptomatic cholelithiasis 08/04/2014    PCP: Kaleen Mask MD  REFERRING PROVIDER: Cammy Copa, MD  REFERRING DIAG: [phone] (ICD-10-CM) - S/P rotator cuff repair  THERAPY DIAG:  Chronic right shoulder pain  Muscle weakness (generalized)  Localized edema  Rationale for Evaluation and Treatment: Rehabilitation  ONSET DATE: Surgery 04/03/2023  SUBJECTIVE:                                                                                                                                                                                      SUBJECTIVE  STATEMENT: Reported having a pinch in shoulder with some movement.  Reported back to work.  Reported trying to watch what he lifted.   PERTINENT HISTORY: Lt shoulder pains concurrent.   PAIN:  NPRS scale: no pain at rest upon arrival.   At worst 4-5/10  Pain location: Rt shoulder anterior shoulder Pain description: pulling, grabbing Aggravating factors: sleeping, random catching Relieving factors: nothing specific noted.   PRECAUTIONS: Shoulder FROM MD referral:  PROM x 2 weeks then AAROM and RC Strengthening  WEIGHT BEARING RESTRICTIONS: Yes  arm of surgery  FALLS:  Has patient fallen in last 6 months? 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   OBJECTIVE:   PATIENT SURVEYS:  06/08/2023:  FOTO update:  74  05/08/2023 FOTO intake:  53   predicted:  70  COGNITION: 05/08/2023 Overall cognitive status: WFL     SENSATION: 05/08/2023 WFL  POSTURE: 05/08/2023 Mild rounded shoulders bilaterally  UPPER EXTREMITY ROM:   ROM Right 05/08/2023 PROM  Right 05/24/2023 AROM Right 06/08/2023 AROM  Shoulder flexion 130 in supine 155 AROM in standing WFL  Shoulder extension   WFL  Shoulder abduction 105 in supine  155 AROM in standing   Shoulder adduction     Shoulder internal rotation 65 in 45 deg abduction WFL reaching behind back in standing 46 deg in 45 deg abduction supine  Shoulder external rotation 38  in 45 deg abduction Wfl reaching behind head in standing 90 deg in 45 deg abduction supine  Elbow flexion     Elbow extension     Wrist flexion     Wrist extension     Wrist ulnar deviation     Wrist radial deviation     Wrist pronation     Wrist supination     (Blank rows = not tested)  UPPER EXTREMITY MMT:  MMT Right 05/08/2023 No testing due to surgery protocol  Left 05/08/2023 Right 05/24/23  Right 06/08/2023  Shoulder flexion  5/5 4+ 5/5  Shoulder extension      Shoulder abduction  5/5 4 4+/5  Shoulder adduction      Shoulder internal rotation  5/5 5 5/5  Shoulder external rotation  4/5 c pain 4 4+/5  Middle trapezius      Lower trapezius      Elbow flexion      Elbow extension      Wrist flexion      Wrist extension      Wrist ulnar deviation      Wrist radial deviation      Wrist pronation      Wrist supination      Grip strength (lbs)      (Blank rows = not tested)  SHOULDER SPECIAL TESTS: 05/08/2023 No specific testing today  JOINT MOBILITY TESTING:  05/08/2023 WFL movement in Rt GH joint in mid ranges all directions.   PALPATION:  05/08/2023 No specific tenderness to light touch                     TODAY'S TREATMENT:                                                                        DATE:06/08/2023 Therex Review of existing HEP and updates to HEP plan for progressive strengthening.  Handout provided. Question and answer time spent Standing green band ER c flexion punch x 5 Wall push up c SA press 3  Machine rows 2 x 15 35 lbs Machine lat pull down 2 x 15  Chest press x 15 20 lbs Rope stretch behind back 30 sec x 2  Rt shoulder  TODAY'S TREATMENT:                                                                        DATE:05/24/2023 Therex Supine shoulder flexion with stretch at end range 5 sec X 10 Sidelying shoulder abduction with stretch at end range 5 sec X 10 Sideling shoulder ER X 2 reps (was easy) progressed to 2# X 10 reps Standing shoulder rows blue X 15 Standing shoulder extensions blue X 15 Standing chest press blue X 15 Standing shoulder IR with towel green X 15 Standing shoulder ER with towel green X 15 Standing shoulder flexion with green X 10 Standing shoulder abd with green X 10 Updated HEP to include strengthening   TODAY'S TREATMENT:                                                                         DATE:05/08/2023 Therex:    HEP instruction/performance c cues for techniques, handout provided.  Trial set performed of each for comprehension and symptom assessment.  See below for exercise list  Manual G2 inferior joint mobs Rt GH joint in flexion, scaption.    PATIENT EDUCATION: 06/08/2023 Education details: HEP, POC Person educated: Patient Education method: Programmer, multimedia, Demonstration, Verbal cues, and Handouts Education comprehension: verbalized understanding, returned demonstration, and verbal cues required  HOME EXERCISE PROGRAM: Access Code: [email or web address] URL: [email or web address] Date: 06/08/2023 Prepared by: Chyrel Masson  Exercises - Standing Shoulder Row with Anchored Resistance  - 2 x daily - 6 x weekly - 1-2 sets - 15 reps - Shoulder extension with resistance - Neutral  - 2 x daily - 6 x weekly - 1-2 sets - 15 reps - Shoulder External Rotation with Anchored Resistance  - 2 x daily - 6 x weekly - 1-2 sets - 15 reps - Shoulder Internal Rotation with Resistance  - 2 x daily - 6 x weekly - 1-2 sets - 15 reps - Standing Shoulder Flexion with Resistance  - 2 x daily - 6 x weekly - 1-2 sets - 10 reps - Standing Single Arm Shoulder Abduction with Resistance (Mirrored)  - 2 x daily - 6 x weekly - 1-2 sets - 10 reps - Wall Push Up with Plus  - 1 x daily - 7 x weekly - 2-3 sets - 10-15 reps - Standing Shoulder Internal Rotation Stretch with Towel  - 1-2 x daily - 7 x weekly - 1 sets - 5 reps - 20-30 hold  ASSESSMENT:  CLINICAL IMPRESSION: Pt has attended sporadically over treatment cycle due to presentation and out of town schedule.  3 visits overall at this time attended.  See objective data for updated information showing good progress in mobility and strength and functional activity.  At this time, progression in HEP appropriate.  Per Pt request, desire to transition to only HEP.  Due to status and knowledge of progression of HEP, trial was appropriate.   OBJECTIVE  IMPAIRMENTS: decreased activity tolerance, decreased coordination,  decreased endurance, decreased mobility, decreased ROM, decreased strength, increased edema, impaired perceived functional ability, impaired flexibility, impaired UE functional use, improper body mechanics, and pain.   ACTIVITY LIMITATIONS: carrying, lifting, sleeping, bathing, toileting, dressing, and reach over head  PARTICIPATION LIMITATIONS: meal prep, cleaning, laundry, interpersonal relationship, driving, shopping, community activity, and yard work  PERSONAL FACTORS: no specific factors are also affecting patient's functional outcome.   REHAB POTENTIAL: Good  CLINICAL DECISION MAKING: Stable/uncomplicated  EVALUATION COMPLEXITY: Low   GOALS: Goals reviewed with patient? Yes  SHORT TERM GOALS: (target date for Short term goals are 3 weeks 05/29/2023)  1.Patient will demonstrate independent use of home exercise program to maintain progress from in clinic treatments. Goal status: ongoing, made updates 05/24/23  LONG TERM GOALS: (target dates for all long term goals are 10 weeks  07/17/2023 )   1. Patient will demonstrate/report pain at worst less than or equal to 2/10 to facilitate minimal limitation in daily activity secondary to pain symptoms. Goal status: on going 06/08/2023    2. Patient will demonstrate independent use of home exercise program to facilitate ability to maintain/progress functional gains from skilled physical therapy services. Goal status: on going 06/08/2023    3. Patient will demonstrate FOTO outcome > or = 70 % to indicate reduced disability due to condition. Goal status: Met 06/08/2023   4.  Patient will demonstrate Rt UE MMT 5/5 throughout to facilitate lifting, reaching, carrying at [facility] in daily activity.   Goal status: on going 06/08/2023    5.  Patient will demonstrate Rt GH joint AROM WFL s symptoms to facilitate usual overhead reaching, self care, dressing at PLOF.    Goal status: on  going 06/08/2023    6.  Patient will demonstrate/report ability to sleep s restriction due to symptoms.  Goal status: on going 06/08/2023    7.  Patient will demonstrate/report ability to return to work.  Goal Status: on going 06/08/2023   PLAN:  PT FREQUENCY: 1-2x/week  PT DURATION: 10 weeks  PLANNED INTERVENTIONS: Therapeutic exercises, Therapeutic activity, Neuro Muscular re-education, Balance training, Gait training, Patient/Family education, Joint mobilization, Stair training, DME instructions, Dry Needling, Electrical stimulation, Traction, Cryotherapy, vasopneumatic device Moist heat, Taping, Ultrasound, Ionotophoresis 4mg /ml Dexamethasone, and aquatic therapy ,Manual therapy.  All included unless contraindicated  PLAN FOR NEXT SESSION: Check on HEP use and overall presentation.  Possible discharge.   MD referral on 05/03/2023:  PROM x 2 weeks then AAROM and RC Strengthening on 05/17/2023 or after   Chyrel Masson, PT, DPT, OCS, ATC 06/08/23  9:16 AM   PHYSICAL THERAPY DISCHARGE SUMMARY  Visits from Start of Care: 3  Current functional level related to goals / functional outcomes: See note   Remaining deficits: See note   Education / Equipment: HEP  Patient goals were partially met. Patient is being discharged due to not returning since the last visit.  Chyrel Masson, PT, DPT, OCS, ATC 07/11/23  3:31 PM

## 2023-06-12 ENCOUNTER — Ambulatory Visit (INDEPENDENT_AMBULATORY_CARE_PROVIDER_SITE_OTHER): Payer: 59 | Admitting: Surgical

## 2023-06-12 DIAGNOSIS — Z9889 Other specified postprocedural states: Secondary | ICD-10-CM

## 2023-06-13 ENCOUNTER — Encounter: Payer: 59 | Admitting: Rehabilitative and Restorative Service Providers"

## 2023-06-14 ENCOUNTER — Encounter: Payer: 59 | Admitting: Orthopedic Surgery

## 2023-06-15 ENCOUNTER — Encounter: Payer: 59 | Admitting: Rehabilitative and Restorative Service Providers"

## 2023-06-18 ENCOUNTER — Encounter: Payer: Self-pay | Admitting: Surgical

## 2023-06-18 NOTE — Progress Notes (Signed)
Post-Op Visit Note   Patient: Casey Hutchinson           Date of Birth: 1966/12/18           MRN: [id number] Visit Date: 06/12/2023 PCP: Kaleen Mask, MD   Assessment & Plan:  Chief Complaint:  Chief Complaint  Patient presents with   Right Shoulder - Routine Post Op   Visit Diagnoses:  1. S/P rotator cuff repair     Plan: Patient is a 57 year old male who presents s/p right shoulder rotator cuff tear repair with bicep tenodesis on 04/03/2023.  Patient states that he is overall doing very well.  Pain in the right shoulder is consistently decreasing and he states that the left shoulder is actually bothering him a lot more than the right shoulder.  He has left shoulder rotator cuff tear that he is planning on getting fixed probably in November.  Still cannot lay on his right shoulder yet.  Has a little bit of sensitivity around the incision.  Only taking Celebrex on occasion to help with shoulder pain.  He has been to 2 sessions of physical therapy but otherwise is doing a home exercise program.  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  He works for himself and without going back to work, he has no income.  On exam, patient has 45 degrees external rotation, 85 degrees abduction, 170 degrees forward elevation passively and actively.  There is very slight crepitus noted over the anterolateral aspect of the shoulder with passive motion.  He has excellent rotator cuff strength of supra, infra, subscap rated 5/5.  Incisions are well-healed.  Axillary nerve intact with deltoid firing.  No Popeye deformity noted.  Plan is to continue with home exercise program for his right shoulder.  I did strongly discourage patient from any heavy lifting with the arm as he is only 2 and half months out from surgery.  However, he is needing to earn income to support himself and his family so he has had to return to work out of  necessity.  This involves fairly heavy lifting so he is looking to hire someone that will help him with the lifting.  Strongly discourage patient from overhead lifting and lifting out away from his body in particular.  He understands that returning to work earlier than recommended comes with increased risk of recurrence of rotator cuff tendon damage and possible need for repeat surgery.  Will plan for patient to follow-up around August or September to discuss surgery on the left shoulder to be done around November.  Follow-Up Instructions: No follow-ups on file.   Orders:  No orders of the defined types were placed in this encounter.  No orders of the defined types were placed in this encounter.   Imaging: No results found.  PMFS History: Patient Active Problem List   Diagnosis Date Noted   Chronic cholecystitis with calculus 08/06/2014   Gallstones 08/04/2014   Symptomatic cholelithiasis 08/04/2014   Past Medical History:  Diagnosis Date   Cholelithiasis    GERD (gastroesophageal reflux disease)     Family History  Problem Relation Age of Onset   COPD Mother     Past Surgical History:  Procedure Laterality Date   CHOLECYSTECTOMY N/A 08/05/2014   Procedure: LAPAROSCOPIC CHOLECYSTECTOMY;  Surgeon: Emelia Loron, MD;  Location: [facility] OR;  Service: General;  Laterality: N/A;   HAND SURGERY  carpel tunnel   SHOULDER SURGERY     VASECTOMY     Social History   Occupational History   Not on file  Tobacco Use   Smoking status: Never   Smokeless tobacco: Never  Substance and Sexual Activity   Alcohol use: No   Drug use: No   Sexual activity: Not on file

## 2023-06-22 ENCOUNTER — Encounter: Payer: 59 | Admitting: Rehabilitative and Restorative Service Providers"

## 2023-07-02 ENCOUNTER — Other Ambulatory Visit: Payer: Self-pay | Admitting: Surgical

## 2023-07-04 ENCOUNTER — Telehealth: Payer: Self-pay | Admitting: Physical Medicine and Rehabilitation

## 2023-07-04 NOTE — Telephone Encounter (Signed)
Patient's wife Annabelle Harman called asked for a call back to schedule an appointment with Dr. Alvester Morin for an injection for patient's neck. Annabelle Harman said patient is having problems with his neck again.  The number to contact Annabelle Harman is [phone]

## 2023-07-05 ENCOUNTER — Telehealth: Payer: Self-pay | Admitting: Physical Medicine and Rehabilitation

## 2023-07-05 NOTE — Telephone Encounter (Signed)
See previous encounter

## 2023-07-05 NOTE — Telephone Encounter (Signed)
Pt wife Annabelle Harman called to set an appt for pt to get an neck injection. Please call Annabelle Harman at [phone].

## 2023-07-05 NOTE — Telephone Encounter (Signed)
LVM to return call to clinic with information concerning patient

## 2023-07-06 NOTE — Telephone Encounter (Signed)
Patient's wife returned call and stated the injection lasted about a month and he had 100% relief during that time. Please advise

## 2023-07-07 ENCOUNTER — Other Ambulatory Visit: Payer: Self-pay | Admitting: Physical Medicine and Rehabilitation

## 2023-07-07 DIAGNOSIS — M5412 Radiculopathy, cervical region: Secondary | ICD-10-CM

## 2023-07-07 NOTE — Telephone Encounter (Signed)
IC advised we would reach out to schedule as soon as auth from insurance received.

## 2023-07-26 ENCOUNTER — Ambulatory Visit: Payer: 59 | Admitting: Physical Medicine and Rehabilitation

## 2023-07-26 ENCOUNTER — Other Ambulatory Visit: Payer: Self-pay

## 2023-07-26 VITALS — BP 143/91 | HR 81

## 2023-07-26 DIAGNOSIS — M5412 Radiculopathy, cervical region: Secondary | ICD-10-CM

## 2023-07-26 MED ORDER — METHYLPREDNISOLONE ACETATE 80 MG/ML IJ SUSP
80.0000 mg | Freq: Once | INTRAMUSCULAR | Status: AC
  Administered 2023-07-26: 80 mg

## 2023-07-26 NOTE — Progress Notes (Signed)
Functional Pain Scale - descriptive words and definitions  Distracting (5)    Aware of pain/able to complete some ADL's but limited by pain/sleep is affected and active distractions are only slightly useful. Moderate range order  Average Pain  varies on time of day and activity   +Driver, -BT, -Dye Allergies.  Neck pain on both sides that radiates in the head and in the shoulders

## 2023-07-26 NOTE — Patient Instructions (Signed)

## 2023-07-28 NOTE — Progress Notes (Signed)
Casey Hutchinson - 57 y.o. male MRN [id number]  Date of birth: 20-May-1966  Office Visit Note: Visit Date: 07/26/2023 PCP: Kaleen Mask, MD Referred by: Kaleen Mask, *  Subjective: Chief Complaint  Patient presents with   Neck - Pain   HPI:  Casey Hutchinson is a 57 y.o. male who comes in today for planned repeat Left C7-T1  Cervical Interlaminar epidural steroid injection with fluoroscopic guidance.  The patient has failed conservative care including home exercise, medications, time and activity modification.  This injection will be diagnostic and hopefully therapeutic.  Please see requesting physician notes for further details and justification. Patient received more than 50% pain relief from prior injection.   Referring: Dr. Burnard Bunting and [facility], PA-C   ROS Otherwise per HPI.  Assessment & Plan: Visit Diagnoses:    ICD-10-CM   1. Cervical radiculopathy  M54.12 XR C-ARM NO REPORT    Epidural Steroid injection    methylPREDNISolone acetate (DEPO-MEDROL) injection 80 mg      Plan: No additional findings.   Meds & Orders:  Meds ordered this encounter  Medications   methylPREDNISolone acetate (DEPO-MEDROL) injection 80 mg    Orders Placed This Encounter  Procedures   XR C-ARM NO REPORT   Epidural Steroid injection    Follow-up: Return for visit to requesting provider as needed.   Procedures: No procedures performed  Cervical Epidural Steroid Injection - Interlaminar Approach with Fluoroscopic Guidance  Patient: Casey Hutchinson      Date of Birth: May 12, 1966 MRN: [id number] PCP: Kaleen Mask, MD      Visit Date: 07/26/2023   Universal Protocol:    Date/Time: 08/02/246:43 AM  Consent Given By: the patient  Position: PRONE  Additional Comments: Vital signs were monitored before and after the procedure. Patient was prepped and draped in the usual sterile fashion. The correct patient, procedure, and site was  verified.   Injection Procedure Details:   Procedure diagnoses: Cervical radiculopathy [M54.12]    Meds Administered:  Meds ordered this encounter  Medications   methylPREDNISolone acetate (DEPO-MEDROL) injection 80 mg     Laterality: Left  Location/Site: C7-T1  Needle: 3.5 in., 20 ga. Tuohy  Needle Placement: Paramedian epidural space  Findings:  -Comments: Excellent flow of contrast into the epidural space.  Procedure Details: Using a paramedian approach from the side mentioned above, the region overlying the inferior lamina was localized under fluoroscopic visualization and the soft tissues overlying this structure were infiltrated with 4 ml. of 1% Lidocaine without Epinephrine. A # 20 gauge, Tuohy needle was inserted into the epidural space using a paramedian approach.  The epidural space was localized using loss of resistance along with contralateral oblique bi-planar fluoroscopic views.  After negative aspirate for air, blood, and CSF, a 2 ml. volume of Isovue-250 was injected into the epidural space and the flow of contrast was observed. Radiographs were obtained for documentation purposes.   The injectate was administered into the level noted above.  Additional Comments:  No complications occurred Dressing: 2 x 2 sterile gauze and Band-Aid    Post-procedure details: Patient was observed during the procedure. Post-procedure instructions were reviewed.  Patient left the clinic in stable condition.   Clinical History: MRI CERVICAL SPINE WITHOUT CONTRAST   TECHNIQUE: Multiplanar, multisequence MR imaging of the cervical spine was performed. No intravenous contrast was administered.   COMPARISON:  Cervical spine radiographs 01/09/2023.   FINDINGS: Alignment: Mild straightening of lumbar lordosis similar to prior  radiographs. No significant scoliosis or spondylolisthesis.   Vertebrae: No marrow edema or evidence of acute osseous abnormality. Background bone  marrow signal within normal limits.   Cord: No cervical spinal cord signal abnormality, despite some degenerative cord mass effect detailed below. Negative visible upper thoracic spinal canal and cord.   Posterior Fossa, vertebral arteries, paraspinal tissues: Cervicomedullary junction is within normal limits. Negative visible posterior fossa. Partially empty sella visible on series 2, image 8.   Preserved major vascular flow voids in the neck with fairly codominant vertebral arteries. Negative visible neck soft tissues, lung apices.   Disc levels:   C2-C3:  Negative.   C3-C4: Disc space loss with circumferential disc bulge and endplate spurring. Broad-based posterior component. Mild spinal stenosis with mild ventral cord mass effect. Mild facet hypertrophy. Mild to moderate right C4 foraminal stenosis.   C4-C5: Mild disc bulging. Mild facet hypertrophy greater on the left. No significant stenosis.   C5-C6: Disc space loss with circumferential disc bulge. Broad-based posterior component. Mild spinal stenosis and ventral cord mass effect (series 6, image 18). Mild to moderate bilateral C6 foraminal stenosis.   C6-C7: Mild circumferential disc bulge and endplate spurring, most affecting the foramina. Mild facet hypertrophy. No spinal stenosis. Mild to moderate bilateral C7 foraminal stenosis.   C7-T1:  Mild facet hypertrophy.  No stenosis.   IMPRESSION: 1. 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 2. Up to moderate neural foraminal stenosis at the right C4, bilateral C6 and C7 nerve levels.     Electronically Signed   By: Odessa Fleming M.D.   On: 03/06/2023 09:24     Objective:  VS:  HT:    WT:   BMI:     BP:(!) 143/91  HR:81bpm  TEMP: ( )  RESP:  Physical Exam Vitals and nursing note reviewed.  Constitutional:      General: He is not in acute distress.     Appearance: Normal appearance. He is not ill-appearing.  HENT:     Head: Normocephalic and atraumatic.     Right Ear: External ear normal.     Left Ear: External ear normal.  Eyes:     Extraocular Movements: Extraocular movements intact.  Cardiovascular:     Rate and Rhythm: Normal rate.     Pulses: Normal pulses.  Abdominal:     General: There is no distension.     Palpations: Abdomen is soft.  Musculoskeletal:        General: No signs of injury.     Cervical back: Neck supple. Tenderness present. No rigidity.     Right lower leg: No edema.     Left lower leg: No edema.     Comments: Patient has good strength in the upper extremities with 5 out of 5 strength in wrist extension long finger flexion APB.  No intrinsic hand muscle atrophy.  Negative Hoffmann's test.  Lymphadenopathy:     Cervical: No cervical adenopathy.  Skin:    Findings: No erythema or rash.  Neurological:     General: No focal deficit present.     Mental Status: He is alert and oriented to person, place, and time.     Sensory: No sensory deficit.     Motor: No weakness or abnormal muscle tone.     Coordination: Coordination normal.  Psychiatric:        Mood and Affect: Mood normal.  Behavior: Behavior normal.      Imaging: No results found.

## 2023-07-28 NOTE — Procedures (Signed)
Cervical Epidural Steroid Injection - Interlaminar Approach with Fluoroscopic Guidance  Patient: Casey Hutchinson      Date of Birth: [birth year] MRN: [id number] PCP: Kaleen Mask, MD      Visit Date: 07/26/2023   Universal Protocol:    Date/Time: 08/02/246:43 AM  Consent Given By: the patient  Position: PRONE  Additional Comments: Vital signs were monitored before and after the procedure. Patient was prepped and draped in the usual sterile fashion. The correct patient, procedure, and site was verified.   Injection Procedure Details:   Procedure diagnoses: Cervical radiculopathy [M54.12]    Meds Administered:  Meds ordered this encounter  Medications   methylPREDNISolone acetate (DEPO-MEDROL) injection 80 mg     Laterality: Left  Location/Site: C7-T1  Needle: 3.5 in., 20 ga. Tuohy  Needle Placement: Paramedian epidural space  Findings:  -Comments: Excellent flow of contrast into the epidural space.  Procedure Details: Using a paramedian approach from the side mentioned above, the region overlying the inferior lamina was localized under fluoroscopic visualization and the soft tissues overlying this structure were infiltrated with 4 ml. of 1% Lidocaine without Epinephrine. A # 20 gauge, Tuohy needle was inserted into the epidural space using a paramedian approach.  The epidural space was localized using loss of resistance along with contralateral oblique bi-planar fluoroscopic views.  After negative aspirate for air, blood, and CSF, a 2 ml. volume of Isovue-250 was injected into the epidural space and the flow of contrast was observed. Radiographs were obtained for documentation purposes.   The injectate was administered into the level noted above.  Additional Comments:  No complications occurred Dressing: 2 x 2 sterile gauze and Band-Aid    Post-procedure details: Patient was observed during the procedure. Post-procedure instructions were  reviewed.  Patient left the clinic in stable condition.

## 2023-07-30 ENCOUNTER — Other Ambulatory Visit: Payer: Self-pay | Admitting: Surgical

## 2023-08-28 ENCOUNTER — Other Ambulatory Visit: Payer: Self-pay | Admitting: Surgical

## 2023-09-29 DIAGNOSIS — R430 Anosmia: Secondary | ICD-10-CM | POA: Diagnosis not present

## 2023-09-29 DIAGNOSIS — J339 Nasal polyp, unspecified: Secondary | ICD-10-CM | POA: Insufficient documentation

## 2023-09-29 DIAGNOSIS — J3489 Other specified disorders of nose and nasal sinuses: Secondary | ICD-10-CM | POA: Insufficient documentation

## 2023-10-14 ENCOUNTER — Ambulatory Visit (INDEPENDENT_AMBULATORY_CARE_PROVIDER_SITE_OTHER): Payer: 59

## 2023-10-14 ENCOUNTER — Encounter: Payer: Self-pay | Admitting: Emergency Medicine

## 2023-10-14 ENCOUNTER — Ambulatory Visit
Admission: EM | Admit: 2023-10-14 | Discharge: 2023-10-14 | Disposition: A | Discharge status: Home / Self Care | Payer: 59 | Attending: Internal Medicine | Admitting: Internal Medicine

## 2023-10-14 DIAGNOSIS — M19071 Primary osteoarthritis, right ankle and foot: Secondary | ICD-10-CM | POA: Diagnosis not present

## 2023-10-14 DIAGNOSIS — M79671 Pain in right foot: Secondary | ICD-10-CM

## 2023-10-14 DIAGNOSIS — M7731 Calcaneal spur, right foot: Secondary | ICD-10-CM | POA: Diagnosis not present

## 2023-10-14 DIAGNOSIS — Q6671 Congenital pes cavus, right foot: Secondary | ICD-10-CM | POA: Diagnosis not present

## 2023-10-14 NOTE — ED Provider Notes (Signed)
[facility]    CSN: [id number] Arrival date & time: 10/14/23  1118      History   Chief Complaint Chief Complaint  Patient presents with   Foot Pain    HPI Casey Hutchinson is a 57 y.o. male.   Patient presents with right heel pain that has been intermittent for the past several months.  Reports that he did take a fall where he hit his heel about 2 months ago but states that heel pain was present prior to that.  He states that the heel pain has been worse the last 3 days.  He does work in [employer] where he does a lot of prolonged standing and walking so is not sure if this is attributing.  He has not taken any medication for pain as he reports that it typically only occurs when he bears weight.  Denies numbness or tingling.  He has not yet been evaluated by provider for this.   Foot Pain    Past Medical History:  Diagnosis Date   Cholelithiasis    GERD (gastroesophageal reflux disease)     Patient Active Problem List   Diagnosis Date Noted   Chronic cholecystitis with calculus 08/06/2014   Gallstones 08/04/2014   Symptomatic cholelithiasis 08/04/2014    Past Surgical History:  Procedure Laterality Date   CHOLECYSTECTOMY N/A 08/05/2014   Procedure: LAPAROSCOPIC CHOLECYSTECTOMY;  Surgeon: Emelia Loron, MD;  Location: [facility] OR;  Service: General;  Laterality: N/A;   HAND SURGERY     carpel tunnel   SHOULDER SURGERY     VASECTOMY         Home Medications    Prior to Admission medications   Medication Sig Start Date End Date Taking? Authorizing Provider  pantoprazole (PROTONIX) 40 MG tablet Take 40 mg by mouth daily.   Yes [provider]  sildenafil (REVATIO) 20 MG tablet Take 20 mg by mouth daily as needed.   Yes [provider]  testosterone cypionate (DEPOTESTOSTERONE CYPIONATE) 200 MG/ML injection Compounded with [place] includes [id number] 0.85 CC Intramuscular Once per week 06/26/23  Yes [provider]  anastrozole  (ARIMIDEX) 1 MG tablet Take 1 mg by mouth once a week. 07/02/23   [provider]  celecoxib (CELEBREX) 100 MG capsule TAKE 1 CAPSULE BY MOUTH TWICE A DAY 08/29/23   Magnant, Charles L, PA-C  HYDROcodone-acetaminophen (NORCO/VICODIN) 5-325 MG tablet Take 1 tablet by mouth every 6 (six) hours as needed for moderate pain. 04/13/23 04/12/24  Magnant, Joycie Peek, PA-C    Family History Family History  Problem Relation Age of Onset   COPD Mother     Social History Social History   Tobacco Use   Smoking status: Never   Smokeless tobacco: Never  Vaping Use   Vaping status: Never Used  Substance Use Topics   Alcohol use: No   Drug use: No     Allergies   Penicillins   Review of Systems Review of Systems Per HPI  Physical Exam Triage Vital Signs ED Triage Vitals  Encounter Vitals Group     BP 10/14/23 1204 121/61     Systolic BP Percentile --      Diastolic BP Percentile --      Pulse --      Resp 10/14/23 1204 18     Temp 10/14/23 1204 98.1 F (36.7 C)     Temp Source 10/14/23 1204 Oral     SpO2 10/14/23 1204 94 %     Weight  10/14/23 1207 200 lb (90.7 kg)     Height 10/14/23 1207 5\' 8"  (1.727 m)     Head Circumference --      Peak Flow --      Pain Score 10/14/23 1206 3     Pain Loc --      Pain Education --      Exclude from Growth Chart --    No data found.  Updated Vital Signs BP 121/61 (BP Location: Left Arm)   Temp 98.1 F (36.7 C) (Oral)   Resp 18   Ht 5\' 8"  (1.727 m)   Wt 200 lb (90.7 kg)   SpO2 94%   BMI 30.41 kg/m   Visual Acuity Right Eye Distance:   Left Eye Distance:   Bilateral Distance:    Right Eye Near:   Left Eye Near:    Bilateral Near:     Physical Exam Constitutional:      General: He is not in acute distress.    Appearance: Normal appearance. He is not toxic-appearing or diaphoretic.  HENT:     Head: Normocephalic and atraumatic.  Eyes:     Extraocular Movements: Extraocular movements intact.     Conjunctiva/sclera:  Conjunctivae normal.  Pulmonary:     Effort: Pulmonary effort is normal.  Feet:     Comments: Mild tenderness to palpation to distal heel that extends slightly into the arch of the foot.  No swelling or discoloration noted.  Patient can wiggle toes.  No tenderness to ankle.  Capillary refill and pulses are intact. Neurological:     General: No focal deficit present.     Mental Status: He is alert and oriented to person, place, and time. Mental status is at baseline.  Psychiatric:        Mood and Affect: Mood normal.        Behavior: Behavior normal.        Thought Content: Thought content normal.        Judgment: Judgment normal.      UC Treatments / Results  Labs (all labs ordered are listed, but only abnormal results are displayed) Labs Reviewed - No data to display  EKG   Radiology DG Foot Complete Right  Result Date: 10/14/2023 CLINICAL DATA:  Heel pain for several months. EXAM: RIGHT FOOT COMPLETE - 3+ VIEW COMPARISON:  None Available. FINDINGS: Moderate degenerative changes at the first MTP joint with joint space narrowing and osteophytic spurring. No discrete erosions. The other mid and hindfoot joint spaces are maintained. No acute bony findings. On the lateral film there is a moderate to large calcaneal heel spur. There is also insertional spurring at the Achilles attachment site. Moderate pes cavus noted. IMPRESSION: 1. Moderate degenerative changes at the first MTP joint. 2. Moderate to large calcaneal heel spur. 3. No acute bony findings. Electronically Signed   By: Rudie Meyer M.D.   On: 10/14/2023 13:42    Procedures Procedures (including critical care time)  Medications Ordered in UC Medications - No data to display  Initial Impression / Assessment and Plan / UC Course  I have reviewed the triage vital signs and the nursing notes.  Pertinent labs & imaging results that were available during my care of the patient were reviewed by me and considered in my  medical decision making (see chart for details).     X-ray of foot is showing spurring of the Achilles at tendon insertion site, calcaneal spur, pes cavus which could all be contributing to  patient's pain.  Called patient to discuss x-ray results.  Advised getting good arch support and supportive care related to pain.  Also advised following up with podiatry so ambulatory referral to podiatry was placed for patient.  Advised patient that if they do not call him in the next few days, he is to call them himself at provided phone number.  Advised strict follow-up precautions.  Patient verbalized understanding and was agreeable with plan. Final Clinical Impressions(s) / UC Diagnoses   Final diagnoses:  Pain of right heel     Discharge Instructions      I will call with x-ray results are abnormal.  Referral to podiatry has been placed.  If they do not call you, please call them at provided phone number.  As we discussed, recommend getting good arch supports in your shoes.    ED Prescriptions   None    PDMP not reviewed this encounter.   Gustavus Bryant, [place] 10/14/23 [phone]

## 2023-10-14 NOTE — ED Triage Notes (Signed)
Patient c/o right heel pain for several months.  Patient states that he fell down some steps x 2 months ago and recently the heel has been bothering him.  Not sure if it's a bone spur or not.  Worse the last 3 mornings.  Denies any OTC pain meds.

## 2023-10-14 NOTE — Discharge Instructions (Signed)
I will call with x-ray results are abnormal.  Referral to podiatry has been placed.  If they do not call you, please call them at provided phone number.  As we discussed, recommend getting good arch supports in your shoes.

## 2023-10-16 DIAGNOSIS — J324 Chronic pansinusitis: Secondary | ICD-10-CM | POA: Diagnosis not present

## 2023-10-16 DIAGNOSIS — R430 Anosmia: Secondary | ICD-10-CM | POA: Diagnosis not present

## 2023-10-16 DIAGNOSIS — J339 Nasal polyp, unspecified: Secondary | ICD-10-CM | POA: Diagnosis not present

## 2023-10-16 DIAGNOSIS — R438 Other disturbances of smell and taste: Secondary | ICD-10-CM | POA: Diagnosis not present

## 2023-10-16 DIAGNOSIS — J3489 Other specified disorders of nose and nasal sinuses: Secondary | ICD-10-CM | POA: Diagnosis not present

## 2023-10-23 ENCOUNTER — Ambulatory Visit: Payer: 59 | Admitting: Podiatry

## 2023-10-27 ENCOUNTER — Ambulatory Visit: Payer: 59 | Admitting: Allergy

## 2023-10-27 ENCOUNTER — Other Ambulatory Visit: Payer: Self-pay

## 2023-10-27 ENCOUNTER — Encounter: Payer: Self-pay | Admitting: Allergy

## 2023-10-27 VITALS — BP 114/78 | HR 60 | Temp 97.9°F | Resp 17 | Ht 67.75 in | Wt 202.6 lb

## 2023-10-27 DIAGNOSIS — J310 Chronic rhinitis: Secondary | ICD-10-CM | POA: Diagnosis not present

## 2023-10-27 DIAGNOSIS — R0981 Nasal congestion: Secondary | ICD-10-CM

## 2023-10-27 DIAGNOSIS — J339 Nasal polyp, unspecified: Secondary | ICD-10-CM

## 2023-10-27 NOTE — Patient Instructions (Addendum)
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 Symptoms temporarily improved with steroids and antibiotics but returned after completion of therapy. Nasal rinses with budesonide have been initiated. Nasal examination revealed significant obstruction with visualized polypoid tissue, particularly on the left side. -Discussed initiating Dupixent therapy for nasal polyp management. This medication is an IL-4 and IL-13 inhibitor, which should help decrease eosinophil production and potentially shrink the polyps, improving nasal obstruction and sense of smell.  Safety and administration protocol every 2 weeks discussed.  Can be self-administered if choose to. First dose to be administered in the office for training purposes.. -Continue nasal rinses with budesonide twice a day. -Plan to reassess in 2-3 months to evaluate response to therapy.  Rhinitis Possible component of nasal symptoms. -Order blood work for allergy testing to identify potential environmental allergens. -Continue steroid rinses as above -May benefit from antihistamine like Zyrtec, Allegra or Xyzal if testing is positive  Follow-up in 2-3 months or sooner if needed

## 2023-10-27 NOTE — Progress Notes (Signed)
New Patient Note  RE: Casey Hutchinson MRN: [id number] DOB: 05/14/1966 Date of Office Visit: 10/27/2023  Primary care provider: Patient, No Pcp Per  Chief Complaint: nasal polyps  History of present illness: Casey Hutchinson is a 57 y.o. male presenting today for evaluation of nasal polyps.   Discussed the use of AI scribe software for clinical note transcription with the patient, who gave verbal consent to proceed.  The patient, with a longstanding history of nasal symptoms, presented with a chief complaint of decreased sense of smell, nasal congestion, and intermittent rhinorrhea. The patient reported these symptoms to be persistent and constant, with a decreased sense of smell.  He does report the ability to taste.  The patient had been on a course of steroids and antibiotic clindamycin, which temporarily improved the symptoms for about a week while he was on the medications, but the symptoms returned after the course was completed.  This issue has been ongoing for approximately five to six years, with the patient also experiencing sinus pressure in the cheeks and forehead, and occasional headaches. The patient initially thought these symptoms were due to a severe head cold, which led to a loss of smell for two years. However, the symptoms returned after what the patient believed to be another head cold.  The patient had a CT scan of the sinuses at his ENT visit, which was reviewed in the office on 10/16/2023. Following the scan, the patient was advised to start nasal steroid rinses with budesonide twice a day.  He denies history of asthma, food allergy and eczema.  He states he does not really have symptoms of environmental allergies but has had occasions where he would tilt his head forward and a lot of clear mucus would drain out.  He states he has taken medicines like ibuprofen, Advil or other NSAIDs without any adverse symptoms.  CT max face 10/16/23 - per ENT reading (I do not have  access to imaging report at time of this visit): "CT scan shows significant chronic sinusitis in all sinuses, with varying degrees of obstruction"   Review of systems: 10pt ROS negative unless noted above in HPI  All other systems negative unless noted above in HPI  Past medical history: Past Medical History:  Diagnosis Date   Cholelithiasis    GERD (gastroesophageal reflux disease)     Past surgical history: Past Surgical History:  Procedure Laterality Date   CHOLECYSTECTOMY N/A 08/05/2014   Procedure: LAPAROSCOPIC CHOLECYSTECTOMY;  Surgeon: Emelia Loron, MD;  Location: [facility] OR;  Service: General;  Laterality: N/A;   HAND SURGERY     carpel tunnel   lower back sugery  2010   SHOULDER SURGERY     VASECTOMY      Family history:  Family History  Problem Relation Age of Onset   COPD Mother     Social history: Lives in a home with carpeting with gas and electric heating and central cooling.  2 dogs in the home.  No concern for water damage, mildew or roaches in the home.  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  Denies smoking history.    Medication List: Current Outpatient Medications  Medication Sig Dispense Refill   Ascorbic Acid (VITAMIN C) 1000 MG tablet Take 1,000 mg by mouth daily.     Magnesium 500 MG CAPS Take 500 mg by mouth daily.     Multiple Vitamins-Minerals (ONE-A-DAY MENS 50+ PO) Take by mouth.     pantoprazole (PROTONIX) 40 MG  tablet Take 40 mg by mouth daily.     testosterone cypionate (DEPOTESTOSTERONE CYPIONATE) 200 MG/ML injection Compounded with [place] includes [id number] 0.85 CC Intramuscular Once per week     vitamin E 180 MG (400 UNITS) capsule Take 400 Units by mouth daily.     anastrozole (ARIMIDEX) 1 MG tablet Take 1 mg by mouth once a week. (Patient not taking: Reported on 10/27/2023)     celecoxib (CELEBREX) 100 MG capsule TAKE 1 CAPSULE BY MOUTH TWICE A DAY (Patient not taking: Reported on 10/27/2023) 60 capsule 0    HYDROcodone-acetaminophen (NORCO/VICODIN) 5-325 MG tablet Take 1 tablet by mouth every 6 (six) hours as needed for moderate pain. (Patient not taking: Reported on 10/27/2023) 30 tablet 0   sildenafil (REVATIO) 20 MG tablet Take 20 mg by mouth daily as needed. (Patient not taking: Reported on 10/27/2023)     No current facility-administered medications for this visit.    Known medication allergies: Allergies  Allergen Reactions   Penicillins Rash     Physical examination: Blood pressure 114/78, pulse 60, temperature 97.9 F (36.6 C), temperature source Temporal, resp. rate 17, height 5' 7.75" (1.721 m), weight 202 lb 9.6 oz (91.9 kg), SpO2 98%.  General: Alert, interactive, in no acute distress. HEENT: PERRLA, TMs pearly gray, turbinates markedly edematous with visible polypoid tissue in the right nostril; left nostril difficult to visualize due to severe obstruction without discharge, post-pharynx non erythematous. Neck: Supple without lymphadenopathy. Lungs: Clear to auscultation without wheezing, rhonchi or rales. {no increased work of breathing. CV: Normal S1, S2 without murmurs. Abdomen: Nondistended, nontender. Skin: Warm and dry, without lesions or rashes. Extremities:  No clubbing, cyanosis or edema. Neuro:   Grossly intact.  Diagnositics/Labs: Discussed options of allergy testing via skin prick versus serum IgE and elected with serum IgE  Assessment and plan:   Nasal Polyps Nasal Congestion Hyposmia Chronic issue for 5-6 years with symptoms of nasal obstruction, intermittent runny nose, and decreased sense of smell. Symptoms temporarily improved with steroids and antibiotics but returned after completion of therapy. Nasal rinses with budesonide have been initiated. Nasal examination revealed significant obstruction with visualized polypoid tissue, particularly on the left side. -Discussed initiating Dupixent therapy for nasal polyp management. This medication is an IL-4 and  IL-13 inhibitor, which should help decrease eosinophil production and potentially shrink the polyps, improving nasal obstruction and sense of smell.  Safety and administration protocol every 2 weeks discussed.  Can be self-administered if choose to. First dose to be administered in the office for training purposes.. -Continue nasal rinses with budesonide twice a day. -Plan to reassess in 2-3 months to evaluate response to therapy.  Rhinitis Possible component of nasal symptoms. -Order blood work for allergy testing to identify potential environmental allergens. -Continue steroid rinses as above -May benefit from antihistamine like Zyrtec, Allegra or Xyzal if testing is positive  Follow-up in 2-3 months or sooner if needed  I appreciate the opportunity to take part in [place] care. Please do not hesitate to contact me with questions.  Sincerely,   Margo Aye, MD Allergy/Immunology [facility]

## 2023-11-01 LAB — ALLERGENS W/TOTAL IGE AREA 2

## 2023-11-02 ENCOUNTER — Telehealth: Payer: Self-pay

## 2023-11-02 NOTE — Telephone Encounter (Signed)
Patients wife called in requesting the results from his blood work. I did let her know they have not been resulted yet. She states they do not use his mychart so please do not send the providers results there. Please give them a call with the results.    She also wants to know if the Dupixent has been approved by insurance. I informed her that I would send a message over to our biologic coordinator.

## 2023-11-03 NOTE — Telephone Encounter (Signed)
Received faxed [facility] ENT office notes from Dr. Ernestene Kiel, MD.  Documents have been placed in provider's in basket for review.  Forwarding message to provider as update.

## 2023-11-03 NOTE — Telephone Encounter (Signed)
Called and spoke to wife and advised awaiting Ct results to get approval for Dupixent looks like they sent clinic notes but no CT will resend record request

## 2023-11-07 ENCOUNTER — Telehealth: Payer: Self-pay | Admitting: *Deleted

## 2023-11-07 NOTE — Telephone Encounter (Signed)
-----   Message from [facility] [facility] Padgett sent at 10/27/2023 12:21 PM EDT ----- Pt interested in Dupixent for polyps.  Discussed this with him.  Has seen ENT x2 and both document polypoid tissue on exam.  I visualized as well on exam.  He had a CT done this month (can't find it in EMR) and requesting records but the ENT read it is chronic sinusitis.   Will this pose a problem if polyps aren't reported on CT?

## 2023-11-07 NOTE — Telephone Encounter (Signed)
Called patient wife and advised approval, copay card and submit to [employer]. Instructions on delivery, storage, dosing and initial appt in clinic for admin instructions

## 2023-11-20 ENCOUNTER — Ambulatory Visit: Payer: 59 | Admitting: *Deleted

## 2023-11-20 DIAGNOSIS — J339 Nasal polyp, unspecified: Secondary | ICD-10-CM

## 2023-11-20 MED ORDER — DUPILUMAB 300 MG/2ML ~~LOC~~ SOSY
300.0000 mg | PREFILLED_SYRINGE | Freq: Once | SUBCUTANEOUS | Status: AC
  Administered 2023-11-20: 300 mg via SUBCUTANEOUS

## 2023-11-20 NOTE — Progress Notes (Signed)
Immunotherapy   Patient Details  Name: Casey Hutchinson MRN: [id number] Date of Birth: 1966/01/19  11/20/2023  Casey Hutchinson started injections for  Dupixent   Frequency: Every 2 Weeks Epi-Pen: Not Required  Consent signed and patient instructions given. Patient started Dupixent today and his wife administered 300mg  into the Right Lower Abdomen. Patient waited 15 minutes in office and did not experience any issues. Patients wife will continue to administer every 2 weeks.   Casey Hutchinson 11/20/2023, 4:14 PM

## 2023-11-21 DIAGNOSIS — Z860100 Personal history of colon polyps, unspecified: Secondary | ICD-10-CM | POA: Diagnosis not present

## 2023-11-21 DIAGNOSIS — K219 Gastro-esophageal reflux disease without esophagitis: Secondary | ICD-10-CM | POA: Diagnosis not present

## 2023-11-21 DIAGNOSIS — R140 Abdominal distension (gaseous): Secondary | ICD-10-CM | POA: Diagnosis not present

## 2024-01-11 ENCOUNTER — Ambulatory Visit: Payer: 59 | Admitting: Allergy

## 2024-01-16 DIAGNOSIS — J324 Chronic pansinusitis: Secondary | ICD-10-CM | POA: Diagnosis not present

## 2024-01-16 DIAGNOSIS — R430 Anosmia: Secondary | ICD-10-CM | POA: Diagnosis not present

## 2024-02-02 ENCOUNTER — Ambulatory Visit: Payer: 59 | Admitting: Allergy

## 2024-02-08 ENCOUNTER — Telehealth: Payer: Self-pay | Admitting: Orthopedic Surgery

## 2024-02-08 NOTE — Telephone Encounter (Signed)
Patient would like to have an other referral to see Dr Alvester Morin for a neck injection, last one was on 06/2023.

## 2024-02-09 ENCOUNTER — Other Ambulatory Visit: Payer: Self-pay | Admitting: Physical Medicine and Rehabilitation

## 2024-02-09 DIAGNOSIS — M5412 Radiculopathy, cervical region: Secondary | ICD-10-CM

## 2024-02-20 ENCOUNTER — Ambulatory Visit: Payer: 59 | Admitting: Physical Medicine and Rehabilitation

## 2024-02-20 ENCOUNTER — Other Ambulatory Visit: Payer: Self-pay

## 2024-02-20 VITALS — BP 142/84 | HR 72

## 2024-02-20 DIAGNOSIS — M5412 Radiculopathy, cervical region: Secondary | ICD-10-CM | POA: Diagnosis not present

## 2024-02-20 MED ORDER — METHYLPREDNISOLONE ACETATE 40 MG/ML IJ SUSP
40.0000 mg | Freq: Once | INTRAMUSCULAR | Status: AC
  Administered 2024-02-20: 40 mg

## 2024-02-20 NOTE — Progress Notes (Signed)
 Pain Score---4 No Blood thinners No Allergies to contrast Dyes

## 2024-02-20 NOTE — Patient Instructions (Signed)

## 2024-02-20 NOTE — Progress Notes (Signed)
 Casey Hutchinson - 58 y.o. male MRN [id number]  Date of birth: Jul 29, 1966  Office Visit Note: Visit Date: 02/20/2024 PCP: Patient, No Pcp Per Referred by: Juanda Chance, NP  Subjective: Chief Complaint  Patient presents with   Neck - Pain   HPI:  Casey Hutchinson is a 58 y.o. male who comes in today for planned repeat Left C7-T1  Cervical Interlaminar epidural steroid injection with fluoroscopic guidance.  The patient has failed conservative care including home exercise, medications, time and activity modification.  This injection will be diagnostic and hopefully therapeutic.  Please see requesting physician notes for further details and justification. Patient received more than 50% pain relief from prior injection.   Referring: Ellin Goodie, FNP   ROS Otherwise per HPI.  Assessment & Plan: Visit Diagnoses:    ICD-10-CM   1. Cervical radiculopathy  M54.12 XR C-ARM NO REPORT    Epidural Steroid injection    methylPREDNISolone acetate (DEPO-MEDROL) injection 40 mg      Plan: No additional findings.   Meds & Orders:  Meds ordered this encounter  Medications   methylPREDNISolone acetate (DEPO-MEDROL) injection 40 mg    Orders Placed This Encounter  Procedures   XR C-ARM NO REPORT   Epidural Steroid injection    Follow-up: Return for visit to requesting provider as needed.   Procedures: No procedures performed  Cervical Epidural Steroid Injection - Interlaminar Approach with Fluoroscopic Guidance  Patient: Casey Hutchinson      Date of Birth: [birth year] MRN: [id number] PCP: Patient, No Pcp Per      Visit Date: 02/20/2024   Universal Protocol:    Date/Time: 02/25/251:08 PM  Consent Given By: the patient  Position: PRONE  Additional Comments: Vital signs were monitored before and after the procedure. Patient was prepped and draped in the usual sterile fashion. The correct patient, procedure, and site was verified.   Injection Procedure Details:    Procedure diagnoses: Cervical radiculopathy [M54.12]    Meds Administered:  Meds ordered this encounter  Medications   methylPREDNISolone acetate (DEPO-MEDROL) injection 40 mg     Laterality: Left  Location/Site: C7-T1  Needle: 3.5 in., 20 ga. Tuohy  Needle Placement: Paramedian epidural space  Findings:  -Comments: Excellent flow of contrast into the epidural space.  Procedure Details: Using a paramedian approach from the side mentioned above, the region overlying the inferior lamina was localized under fluoroscopic visualization and the soft tissues overlying this structure were infiltrated with 4 ml. of 1% Lidocaine without Epinephrine. A # 20 gauge, Tuohy needle was inserted into the epidural space using a paramedian approach.  The epidural space was localized using loss of resistance along with contralateral oblique bi-planar fluoroscopic views.  After negative aspirate for air, blood, and CSF, a 2 ml. volume of Isovue-250 was injected into the epidural space and the flow of contrast was observed. Radiographs were obtained for documentation purposes.   The injectate was administered into the level noted above.  Additional Comments:  No complications occurred Dressing: 2 x 2 sterile gauze and Band-Aid    Post-procedure details: Patient was observed during the procedure. Post-procedure instructions were reviewed.  Patient left the clinic in stable condition.   Clinical History: MRI CERVICAL SPINE WITHOUT CONTRAST   TECHNIQUE: Multiplanar, multisequence MR imaging of the cervical spine was performed. No intravenous contrast was administered.   COMPARISON:  Cervical spine radiographs 01/09/2023.   FINDINGS: Alignment: Mild straightening of lumbar lordosis similar to prior radiographs. No significant scoliosis or  spondylolisthesis.   Vertebrae: No marrow edema or evidence of acute osseous abnormality. Background bone marrow signal within normal limits.   Cord:  No cervical spinal cord signal abnormality, despite some degenerative cord mass effect detailed below. Negative visible upper thoracic spinal canal and cord.   Posterior Fossa, vertebral arteries, paraspinal tissues: Cervicomedullary junction is within normal limits. Negative visible posterior fossa. Partially empty sella visible on series 2, image 8.   Preserved major vascular flow voids in the neck with fairly codominant vertebral arteries. Negative visible neck soft tissues, lung apices.   Disc levels:   C2-C3:  Negative.   C3-C4: Disc space loss with circumferential disc bulge and endplate spurring. Broad-based posterior component. Mild spinal stenosis with mild ventral cord mass effect. Mild facet hypertrophy. Mild to moderate right C4 foraminal stenosis.   C4-C5: Mild disc bulging. Mild facet hypertrophy greater on the left. No significant stenosis.   C5-C6: Disc space loss with circumferential disc bulge. Broad-based posterior component. Mild spinal stenosis and ventral cord mass effect (series 6, image 18). Mild to moderate bilateral C6 foraminal stenosis.   C6-C7: Mild circumferential disc bulge and endplate spurring, most affecting the foramina. Mild facet hypertrophy. No spinal stenosis. Mild to moderate bilateral C7 foraminal stenosis.   C7-T1:  Mild facet hypertrophy.  No stenosis.   IMPRESSION: 1. 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 2. Up to moderate neural foraminal stenosis at the right C4, bilateral C6 and C7 nerve levels.     Electronically Signed   By: Odessa Fleming M.D.   On: 03/06/2023 09:24     Objective:  VS:  HT:    WT:   BMI:     BP:(!) 142/84  HR:72bpm  TEMP: ( )  RESP:  Physical Exam Vitals and nursing note reviewed.  Constitutional:      General: He is not in acute distress.    Appearance: Normal appearance. He is not  ill-appearing.  HENT:     Head: Normocephalic and atraumatic.     Right Ear: External ear normal.     Left Ear: External ear normal.  Eyes:     Extraocular Movements: Extraocular movements intact.  Cardiovascular:     Rate and Rhythm: Normal rate.     Pulses: Normal pulses.  Abdominal:     General: There is no distension.     Palpations: Abdomen is soft.  Musculoskeletal:        General: No signs of injury.     Cervical back: Neck supple. Tenderness present. No rigidity.     Right lower leg: No edema.     Left lower leg: No edema.     Comments: Patient has good strength in the upper extremities with 5 out of 5 strength in wrist extension long finger flexion APB.  No intrinsic hand muscle atrophy.  Negative Hoffmann's test.  Lymphadenopathy:     Cervical: No cervical adenopathy.  Skin:    Findings: No erythema or rash.  Neurological:     General: No focal deficit present.     Mental Status: He is alert and oriented to person, place, and time.     Sensory: No sensory deficit.     Motor: No weakness or abnormal muscle tone.     Coordination: Coordination normal.  Psychiatric:        Mood and Affect: Mood normal.        Behavior: Behavior  normal.      Imaging: No results found.

## 2024-02-20 NOTE — Procedures (Signed)
 Cervical Epidural Steroid Injection - Interlaminar Approach with Fluoroscopic Guidance  Patient: Casey Hutchinson      Date of Birth: [birth year] MRN: [id number] PCP: Patient, No Pcp Per      Visit Date: 02/20/2024   Universal Protocol:    Date/Time: 02/25/251:08 PM  Consent Given By: the patient  Position: PRONE  Additional Comments: Vital signs were monitored before and after the procedure. Patient was prepped and draped in the usual sterile fashion. The correct patient, procedure, and site was verified.   Injection Procedure Details:   Procedure diagnoses: Cervical radiculopathy [M54.12]    Meds Administered:  Meds ordered this encounter  Medications   methylPREDNISolone acetate (DEPO-MEDROL) injection 40 mg     Laterality: Left  Location/Site: C7-T1  Needle: 3.5 in., 20 ga. Tuohy  Needle Placement: Paramedian epidural space  Findings:  -Comments: Excellent flow of contrast into the epidural space.  Procedure Details: Using a paramedian approach from the side mentioned above, the region overlying the inferior lamina was localized under fluoroscopic visualization and the soft tissues overlying this structure were infiltrated with 4 ml. of 1% Lidocaine without Epinephrine. A # 20 gauge, Tuohy needle was inserted into the epidural space using a paramedian approach.  The epidural space was localized using loss of resistance along with contralateral oblique bi-planar fluoroscopic views.  After negative aspirate for air, blood, and CSF, a 2 ml. volume of Isovue-250 was injected into the epidural space and the flow of contrast was observed. Radiographs were obtained for documentation purposes.   The injectate was administered into the level noted above.  Additional Comments:  No complications occurred Dressing: 2 x 2 sterile gauze and Band-Aid    Post-procedure details: Patient was observed during the procedure. Post-procedure instructions were reviewed.  Patient  left the clinic in stable condition.

## 2024-03-01 ENCOUNTER — Other Ambulatory Visit: Payer: Self-pay | Admitting: Otolaryngology

## 2024-03-01 DIAGNOSIS — J338 Other polyp of sinus: Secondary | ICD-10-CM | POA: Diagnosis not present

## 2024-03-01 DIAGNOSIS — J320 Chronic maxillary sinusitis: Secondary | ICD-10-CM | POA: Diagnosis not present

## 2024-03-01 DIAGNOSIS — J322 Chronic ethmoidal sinusitis: Secondary | ICD-10-CM | POA: Diagnosis not present

## 2024-03-01 DIAGNOSIS — J339 Nasal polyp, unspecified: Secondary | ICD-10-CM | POA: Diagnosis not present

## 2024-03-01 DIAGNOSIS — R930 Abnormal findings on diagnostic imaging of skull and head, not elsewhere classified: Secondary | ICD-10-CM | POA: Diagnosis not present

## 2024-03-05 LAB — SURGICAL PATHOLOGY

## 2024-03-14 DIAGNOSIS — J324 Chronic pansinusitis: Secondary | ICD-10-CM | POA: Diagnosis not present

## 2024-03-14 DIAGNOSIS — J339 Nasal polyp, unspecified: Secondary | ICD-10-CM | POA: Diagnosis not present

## 2024-04-08 ENCOUNTER — Telehealth: Payer: Self-pay | Admitting: Allergy

## 2024-04-08 NOTE — Telephone Encounter (Signed)
 Called to schedule Dupixent reapproval appointment.

## 2024-04-16 DIAGNOSIS — J324 Chronic pansinusitis: Secondary | ICD-10-CM | POA: Diagnosis not present

## 2024-06-12 NOTE — Progress Notes (Deleted)
   [address] [place] [address] Dept: [phone]  FOLLOW UP NOTE  Patient ID: Casey Hutchinson, male    DOB: 1966/10/25  Age: 58 y.o. MRN: [id number] Date of Office Visit: 06/13/2024  Assessment  Chief Complaint: No chief complaint on file.  HPI Casey Hutchinson is a 58 year old male who presents to the clinic for a follow up visit. He was last seen in this clinic on 10/27/2023 by Dr. Tempie Fee as a new patient for evaluation of nasal polyposis and chronic rhinitis. His last environmental allergy testing via lab on 10/27/2023 was negative to the environmental panel. He continues to follow up with Dr. Donalee Fruits, ENT, with his last appointment on 04/16/2024.   Discussed the use of AI scribe software for clinical note transcription with the patient, who gave verbal consent to proceed.  History of Present Illness      Drug Allergies:  Allergies  Allergen Reactions   Penicillins Rash    Physical Exam: There were no vitals taken for this visit.   Physical Exam  Diagnostics:    Assessment and Plan: No diagnosis found.  No orders of the defined types were placed in this encounter.   There are no Patient Instructions on file for this visit.  No follow-ups on file.    Thank you for the opportunity to care for this patient.  Please do not hesitate to contact me with questions.  Marinus Sic, FNP [facility]

## 2024-06-12 NOTE — Patient Instructions (Incomplete)
 Chronic rhinitis Consider saline nasal rinses as needed for nasal symptoms. Use this before any medicated nasal sprays for best result  Nasal polyposis Continue a twice day budesonide nasal rinse schedule Continue Dupixent  injections once every 2 weeks to control nasal polyposis.  Call the clinic if this treatment plan is not working well for you  Follow up in *** or sooner if needed.

## 2024-06-13 ENCOUNTER — Ambulatory Visit: Admitting: Family Medicine

## 2024-06-19 NOTE — Patient Instructions (Signed)
 Chronic rhinitis Consider saline nasal rinses as needed for nasal symptoms. Use this before any medicated nasal sprays for best result  Nasal polyposis Continue a twice day budesonide nasal rinse schedule Continue Dupixent  injections once every 2 weeks to control nasal polyposis.  Call the clinic if this treatment plan is not working well for you  Follow up in *** or sooner if needed.

## 2024-06-19 NOTE — Progress Notes (Signed)
   [address] [place] [address] Dept: [phone]  FOLLOW UP NOTE  Patient ID: Casey Hutchinson, male    DOB: 1966/01/31  Age: 58 y.o. MRN: [id number] Date of Office Visit: 06/20/2024  Assessment  Chief Complaint: No chief complaint on file.  HPI Casey Hutchinson is a 58 year old male who presents to the clinic for a follow up visit. He was last seen in this clinic on 10/27/2023 by Dr. Jeneal as a new patient for evaluation of nasal polyposis and chronic rhinitis. His last environmental allergy testing via lab on 10/27/2023 was negative to the environmental panel. He continues to follow up with Dr. Jesus, ENT, with his last appointment on 04/16/2024.   Discussed the use of AI scribe software for clinical note transcription with the patient, who gave verbal consent to proceed.  History of Present Illness      Drug Allergies:  Allergies  Allergen Reactions   Penicillins Rash    Physical Exam: There were no vitals taken for this visit.   Physical Exam  Diagnostics:    Assessment and Plan: No diagnosis found.  No orders of the defined types were placed in this encounter.   There are no Patient Instructions on file for this visit.  No follow-ups on file.    Thank you for the opportunity to care for this patient.  Please do not hesitate to contact me with questions.  Arlean Mutter, FNP [facility]

## 2024-06-20 ENCOUNTER — Other Ambulatory Visit: Payer: Self-pay

## 2024-06-20 ENCOUNTER — Encounter: Payer: Self-pay | Admitting: Family Medicine

## 2024-06-20 ENCOUNTER — Ambulatory Visit: Admitting: Family Medicine

## 2024-06-20 VITALS — BP 120/80 | HR 68 | Temp 98.1°F | Resp 12 | Ht 67.75 in | Wt 202.3 lb

## 2024-06-20 DIAGNOSIS — J339 Nasal polyp, unspecified: Secondary | ICD-10-CM

## 2024-06-20 DIAGNOSIS — J310 Chronic rhinitis: Secondary | ICD-10-CM

## 2024-10-28 ENCOUNTER — Encounter: Payer: Self-pay | Admitting: Radiology

## 2024-11-12 ENCOUNTER — Other Ambulatory Visit: Payer: Self-pay | Admitting: *Deleted

## 2024-11-12 MED ORDER — DUPIXENT 300 MG/2ML ~~LOC~~ SOSY: 300.0000 mg | PREFILLED_SYRINGE | SUBCUTANEOUS | 11 refills | Status: AC

## 2024-11-14 ENCOUNTER — Other Ambulatory Visit: Payer: Self-pay | Admitting: Gastroenterology

## 2024-11-14 DIAGNOSIS — R109 Unspecified abdominal pain: Secondary | ICD-10-CM | POA: Diagnosis not present

## 2024-11-14 DIAGNOSIS — Z860100 Personal history of colon polyps, unspecified: Secondary | ICD-10-CM | POA: Diagnosis not present

## 2024-11-14 DIAGNOSIS — R140 Abdominal distension (gaseous): Secondary | ICD-10-CM | POA: Diagnosis not present

## 2024-11-14 DIAGNOSIS — K219 Gastro-esophageal reflux disease without esophagitis: Secondary | ICD-10-CM | POA: Diagnosis not present

## 2024-11-20 ENCOUNTER — Other Ambulatory Visit: Payer: Self-pay

## 2024-11-20 ENCOUNTER — Ambulatory Visit: Admission: EM | Admit: 2024-11-20 | Discharge: 2024-11-20 | Disposition: A | Discharge status: Home / Self Care

## 2024-11-20 ENCOUNTER — Ambulatory Visit (INDEPENDENT_AMBULATORY_CARE_PROVIDER_SITE_OTHER)

## 2024-11-20 ENCOUNTER — Ambulatory Visit: Payer: Self-pay

## 2024-11-20 DIAGNOSIS — T59811A Toxic effect of smoke, accidental (unintentional), initial encounter: Secondary | ICD-10-CM

## 2024-11-20 DIAGNOSIS — J705 Respiratory conditions due to smoke inhalation: Secondary | ICD-10-CM | POA: Diagnosis not present

## 2024-11-20 DIAGNOSIS — R0989 Other specified symptoms and signs involving the circulatory and respiratory systems: Secondary | ICD-10-CM | POA: Diagnosis not present

## 2024-11-20 DIAGNOSIS — R0602 Shortness of breath: Secondary | ICD-10-CM

## 2024-11-20 LAB — POC COVID19/FLU A&B COMBO
Covid Antigen, POC: NEGATIVE
Influenza A Antigen, POC: NEGATIVE
Influenza B Antigen, POC: NEGATIVE

## 2024-11-20 MED ORDER — AZELASTINE HCL 0.1 % NA SOLN: 1.0000 | Freq: Two times a day (BID) | NASAL | 1 refills | Status: AC

## 2024-11-20 MED ORDER — ACETAMINOPHEN 325 MG PO TABS
650.0000 mg | ORAL_TABLET | Freq: Once | ORAL | Status: AC
  Administered 2024-11-20: 650 mg via ORAL

## 2024-11-20 MED ORDER — PROMETHAZINE-DM 6.25-15 MG/5ML PO SYRP: 10.0000 mL | ORAL_SOLUTION | Freq: Three times a day (TID) | ORAL | 0 refills | Status: AC | PRN

## 2024-11-20 MED ORDER — ALBUTEROL SULFATE HFA 108 (90 BASE) MCG/ACT IN AERS: 1.0000 | INHALATION_SPRAY | Freq: Four times a day (QID) | RESPIRATORY_TRACT | 0 refills | Status: AC | PRN

## 2024-11-20 MED ORDER — PREDNISONE 20 MG PO TABS: 40.0000 mg | ORAL_TABLET | Freq: Every day | ORAL | 0 refills | Status: AC
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
## 2024-12-02 ENCOUNTER — Ambulatory Visit
Admission: RE | Admit: 2024-12-02 | Discharge: 2024-12-02 | Disposition: A | Source: Ambulatory Visit | Attending: Gastroenterology | Admitting: Gastroenterology

## 2024-12-02 DIAGNOSIS — R1010 Upper abdominal pain, unspecified: Secondary | ICD-10-CM | POA: Diagnosis not present

## 2024-12-02 DIAGNOSIS — R109 Unspecified abdominal pain: Secondary | ICD-10-CM

## 2024-12-04 ENCOUNTER — Encounter: Payer: Self-pay | Admitting: General Practice

## 2024-12-06 ENCOUNTER — Ambulatory Visit: Payer: Self-pay

## 2024-12-06 NOTE — Telephone Encounter (Signed)
 FYI Only or Action Required?: Action required by provider: request for appointment.  Patient was last seen in primary care on .  Called Nurse Triage reporting Abdominal Pain.  Symptoms began several months ago.  Interventions attempted: Nothing.  Symptoms are: unchanged. Pt. Has a new patient appointment. Will go to UC as needed.  Triage Disposition: See PCP Within 2 Weeks  Patient/caregiver understands and will follow disposition?: Yes     Copied from CRM #[id number]. Topic: Clinical - Red Word Triage >> Dec 06, 2024 10:51 AM Macario HERO wrote: Red Word that prompted transfer to Nurse Triage: Patient experiencing swelling and stomach pain. Reason for Disposition  Abdominal pain is a chronic symptom (recurrent or ongoing AND present > 4 weeks)  Answer Assessment - Initial Assessment Questions 1. LOCATION: Where does it hurt?      Right side 2. RADIATION: Does the pain shoot anywhere else? (e.g., chest, back)     no 3. ONSET: When did the pain begin? (Minutes, hours or days ago)      Several months 4. SUDDEN: Gradual or sudden onset?     gradual 5. PATTERN Does the pain come and go, or is it constant?     Comes and goes 6. SEVERITY: How bad is the pain?  (e.g., Scale 1-10; mild, moderate, or severe)     moderate 7. RECURRENT SYMPTOM: Have you ever had this type of stomach pain before? If Yes, ask: When was the last time? and What happened that time?      yes 8. CAUSE: What do you think is causing the stomach pain? (e.g., gallstones, recent abdominal surgery)     unsure 9. RELIEVING/AGGRAVATING FACTORS: What makes it better or worse? (e.g., antacids, bending or twisting motion, bowel movement)     no 10. OTHER SYMPTOMS: Do you have any other symptoms? (e.g., back pain, diarrhea, fever, urination pain, vomiting)       bloating  Protocols used: Abdominal Pain - Male-A-AH

## 2024-12-10 ENCOUNTER — Other Ambulatory Visit: Payer: Self-pay | Admitting: Gastroenterology

## 2024-12-10 DIAGNOSIS — R430 Anosmia: Secondary | ICD-10-CM

## 2024-12-10 DIAGNOSIS — R140 Abdominal distension (gaseous): Secondary | ICD-10-CM

## 2024-12-13 ENCOUNTER — Inpatient Hospital Stay: Admission: RE | Admit: 2024-12-13 | Source: Ambulatory Visit

## 2024-12-13 DIAGNOSIS — K760 Fatty (change of) liver, not elsewhere classified: Secondary | ICD-10-CM | POA: Diagnosis not present

## 2024-12-13 DIAGNOSIS — R430 Anosmia: Secondary | ICD-10-CM

## 2024-12-13 DIAGNOSIS — R140 Abdominal distension (gaseous): Secondary | ICD-10-CM

## 2024-12-13 MED ORDER — IOPAMIDOL (ISOVUE-300) INJECTION 61%
100.0000 mL | Freq: Once | INTRAVENOUS | Status: AC | PRN
  Administered 2024-12-13: 100 mL via INTRAVENOUS

## 2024-12-17 ENCOUNTER — Ambulatory Visit

## 2024-12-18 ENCOUNTER — Ambulatory Visit: Payer: Self-pay

## 2024-12-18 NOTE — Telephone Encounter (Signed)
 FYI Only or Action Required?: FYI only for provider: advised to schedule.  Patient is followed in Pulmonology for n/a, last seen on n/a.  Called Nurse Triage reporting Shortness of Breath.  Symptoms began several months ago.  Interventions attempted: Nothing.  Symptoms are: unchanged.  Triage Disposition: See HCP Within 4 Hours (Or PCP Triage)  Patient/caregiver understands and will follow disposition?: Yes  Copied from CRM #[id number]. Topic: Clinical - Red Word Triage >> Dec 18, 2024  9:32 AM Isabell A wrote: Red Word that prompted transfer to Nurse Triage: SOB, chest tightness and a constant cough. Reason for Disposition  [1] MILD difficulty breathing (e.g., minimal/no SOB at rest, SOB with walking, pulse < 100) AND [2] NEW-onset or WORSE than normal  Continuous (nonstop) coughing interferes with work, school, or sleeping  Answer Assessment - Initial Assessment Questions Pt's wife calling to schedule him with a new pt appt. He has been having shortness of breath with exertion. She states he has acid reflux and sees Dr. Burnette. She states he has had testing done because he has been having a worsening cough and swelling in his stomach area. They told him he needs to figure out if it's his lungs or heart. He is scheduled with cardiologist in January. She states he does get chest pain, shortness of breath and has this chronic cough. She states chest pain is a tightness and happens very intermittently. She states the cough is constant. Shortness of breath is only with exertion, RN asked even just walking from one room to another, she stated no only when he really gets moving around. RN did give instructions to help with the cough, especially at night. RN also gave instructions on when to go to the ER. Transferred to [facility] to schedule.  No higher acuity symptoms currently.      1. RESPIRATORY STATUS: Describe your breathing? (e.g., wheezing, shortness of breath, unable to speak, severe  coughing)      Shortness of breath with exertion 2. ONSET: When did this breathing problem begin?      A couple of months 3. PATTERN Does the difficult breathing come and go, or has it been constant since it started?      intermittent 4. RECURRENT SYMPTOM: Have you had difficulty breathing before? If Yes, ask: When was the last time? and What happened that time?      Just the last couple of months  5. CARDIAC HISTORY: Do you have any history of heart disease? (e.g., heart attack, angina, bypass surgery, angioplasty)      no 6. LUNG HISTORY: Do you have any history of lung disease?  (e.g., pulmonary embolus, asthma, emphysema)     no 7. CAUSE: What do you think is causing the breathing problem?      unknown 8. OTHER SYMPTOMS: Do you have any other symptoms? (e.g., chest pain, cough, dizziness, fever, runny nose)     Chest tightness and cough  Protocols used: Breathing Difficulty-A-AH

## 2024-12-27 ENCOUNTER — Encounter: Payer: Self-pay | Admitting: *Deleted

## 2024-12-30 ENCOUNTER — Encounter: Payer: Self-pay | Admitting: Internal Medicine

## 2024-12-30 ENCOUNTER — Ambulatory Visit: Admitting: Internal Medicine

## 2024-12-30 ENCOUNTER — Ambulatory Visit: Attending: Internal Medicine | Admitting: Internal Medicine

## 2024-12-30 VITALS — BP 130/78 | HR 62 | Ht 68.0 in | Wt 213.5 lb

## 2024-12-30 DIAGNOSIS — R079 Chest pain, unspecified: Secondary | ICD-10-CM

## 2024-12-30 DIAGNOSIS — R0602 Shortness of breath: Secondary | ICD-10-CM

## 2024-12-30 DIAGNOSIS — E291 Testicular hypofunction: Secondary | ICD-10-CM

## 2024-12-30 DIAGNOSIS — R072 Precordial pain: Secondary | ICD-10-CM

## 2024-12-30 MED ORDER — NITROGLYCERIN 0.4 MG SL SUBL: 0.4000 mg | SUBLINGUAL_TABLET | SUBLINGUAL | 3 refills | Status: AC | PRN

## 2024-12-30 MED ORDER — ASPIRIN 81 MG PO TBEC: 81.0000 mg | DELAYED_RELEASE_TABLET | Freq: Every day | ORAL | Status: AC

## 2024-12-30 NOTE — Patient Instructions (Addendum)
 Medication Instructions:  Your physician has recommended you make the following change in your medication:  Start aspirin  81 mg by mouth daily  A prescription for nitroglycerin  to use as needed for chest pain has been sent to your pharmacy  *If you need a refill on your cardiac medications before your next appointment, please call your pharmacy*  Lab Work: None--please ask your primary care provider to send over results of lab work done today If you have labs (blood work) drawn today and your tests are completely normal, you will receive your results only by: MyChart Message (if you have MyChart) OR A paper copy in the mail If you have any lab test that is abnormal or we need to change your treatment, we will call you to review the results.  Testing/Procedures: Your physician has requested that you have an echocardiogram. Echocardiography is a painless test that uses sound waves to create images of your heart. It provides your doctor with information about the size and shape of your heart and how well your hearts chambers and valves are working. This procedure takes approximately one hour. There are no restrictions for this procedure. Please do NOT wear cologne, perfume, aftershave, or lotions (deodorant is allowed). Please arrive 15 minutes prior to your appointment time.  Please note: We ask at that you not bring children with you during ultrasound (echo/ vascular) testing. Due to room size and safety concerns, children are not allowed in the ultrasound rooms during exams. Our front office staff cannot provide observation of children in our lobby area while testing is being conducted. An adult accompanying a patient to their appointment will only be allowed in the ultrasound room at the discretion of the ultrasound technician under special circumstances. We apologize for any inconvenience.  Your physician has requested that you have cardiac CT. Cardiac computed tomography (CT) is a painless  test that uses an x-ray machine to take clear, detailed pictures of your heart. For further information please visit [email or web address] Please follow instruction sheet as given.    Follow-Up: At [facility], you and your health needs are our priority.  As part of our continuing mission to provide you with exceptional heart care, our providers are all part of one team.  This team includes your primary Cardiologist (physician) and Advanced Practice Providers or APPs (Physician Assistants and Nurse Practitioners) who all work together to provide you with the care you need, when you need it.  Your next appointment:   4-6  week(s)  Provider:   Emeline FORBES Calender, DO    We recommend signing up for the patient portal called MyChart.  Sign up information is provided on this After Visit Summary.  MyChart is used to connect with patients for Virtual Visits (Telemedicine).  Patients are able to view lab/test results, encounter notes, upcoming appointments, etc.  Non-urgent messages can be sent to your provider as well.   To learn more about what you can do with MyChart, go to [email or web address].   Other Instructions     Your cardiac CT will be scheduled at one of the below locations:   [facility] [address] [place], [place] [address] [phone] (Severe contrast allergies only)  OR   [facility] [address] [place], [place] [address] [phone]  OR   [facility] [address] [place], [place] [address] [phone]  OR   [facility] [address]  [place],  [place] [address]  OR   [facility] [address] [place], [place] [phone]  If scheduled at [facility], please arrive at the [facility] and Children's Entrance (Entrance C2) of [facility] 30 minutes prior to test start time. You can use the FREE valet parking offered at entrance C  (encouraged to control the heart rate for the test)  Proceed to the [facility] Radiology Department (first floor) to check-in and test prep.  All radiology patients and guests should use entrance C2 at [facility], accessed from [facility], even though the hospital's physical address listed is [address].  If scheduled at the Heart and Vascular Tower at [address], please enter the parking lot using the [address] entrance and use the FREE valet service at the patient drop-off area. Enter the building and check-in with registration on the main floor.  If scheduled at [facility], please arrive to the Heart and Vascular Center 15 mins early for check-in and test prep.  There is spacious parking and easy access to the radiology department from the [facility] Heart and Vascular entrance. Please enter here and check-in with the desk attendant.   If scheduled at [facility], please arrive 30 minutes early for check-in and test prep.  Please follow these instructions carefully (unless otherwise directed):  An IV will be required for this test and Nitroglycerin  will be given.  Hold all erectile dysfunction medications at least 3 days (72 hrs) prior to test. (Ie viagra, cialis, sildenafil, tadalafil, etc)   On the Night Before the Test: Be sure to Drink plenty of water . Do not consume any caffeinated/decaffeinated beverages or chocolate 12 hours prior to your test. Do not take any antihistamines 12 hours prior to your test.   On the Day of the Test: Drink plenty of water  until 1 hour prior to the test. Do not eat any food 1 hour prior to test. You may take your regular medications prior to the test.   If you take Furosemide/Hydrochlorothiazide/Spironolactone/Chlorthalidone, please HOLD on the morning of the test. Patients who wear a continuous glucose monitor MUST remove the device prior to scanning.         After the  Test: Drink plenty of water . After receiving IV contrast, you may experience a mild flushed feeling. This is normal. On occasion, you may experience a mild rash up to 24 hours after the test. This is not dangerous. If this occurs, you can take Benadryl 25 mg, Zyrtec, Claritin, or Allegra and increase your fluid intake. (Patients taking Tikosyn should avoid Benadryl, and may take Zyrtec, Claritin, or Allegra) If you experience trouble breathing, this can be serious. If it is severe call 911 IMMEDIATELY. If it is mild, please call our office.  We will call to schedule your test 2-4 weeks out understanding that some insurance companies will need an authorization prior to the service being performed.   For more information and frequently asked questions, please visit our website : [email or web address]  For non-scheduling related questions, please contact the cardiac imaging nurse navigator should you have any questions/concerns: Cardiac Imaging Nurse Navigators Direct Office Dial: [phone]   For scheduling needs, including cancellations and rescheduling, please call Brittany, [phone].      Nitroglycerin  Sublingual Tablets What is this medication? NITROGLYCERIN  (nye troe GLI ser in) prevents and treats chest pain (angina). It works by relaxing blood vessels, which decreases the amount of work the heart has to do. It  belongs to a group of medications called nitrates. This medicine may be used for other purposes; ask your health care provider or pharmacist if you have questions. COMMON BRAND NAME(S): Nitroquick, Nitrostat , Nitrotab What should I tell my care team before I take this medication? They need to know if you have any of these conditions: Anemia Head injury, recent stroke, or bleeding in the brain Liver disease Previous heart attack An unusual or allergic reaction to nitroglycerin , other medications, foods, dyes, or preservatives Pregnant or trying to get  pregnant Breast-feeding How should I use this medication? Take this medication by mouth as needed. Use at the first sign of an angina attack (chest pain or tightness). You can also take this medication 5 to 10 minutes before an event likely to produce chest pain. Follow the directions exactly as written on the prescription label. Place one tablet under your tongue and let it dissolve. Do not swallow whole. Replace the dose if you accidentally swallow it. It will help if your mouth is not dry. Saliva around the tablet will help it to dissolve more quickly. Do not eat or drink, smoke or chew tobacco while a tablet is dissolving. Sit down when taking this medication. In an angina attack, you should feel better within 5 minutes after your first dose. You can take a dose every 5 minutes up to a total of 3 doses. If you do not feel better or feel worse after 1 dose, call 9-1-1 at once. Do not take more than 3 doses in 15 minutes. Your care team might give you other directions. Follow those directions if they do. Do not take your medication more often than directed. Talk to your care team about the use of this medication in children. Special care may be needed. Overdosage: If you think you have taken too much of this medicine contact a poison control center or emergency room at once. NOTE: This medicine is only for you. Do not share this medicine with others. What if I miss a dose? This does not apply. This medication is only used as needed. What may interact with this medication? Do not take this medication with any of the following: Certain migraine medications, such as ergotamine and dihydroergotamine (DHE) Medications used to treat erectile dysfunction, such as sildenafil, tadalafil, and vardenafil Riociguat This medication may also interact with the following: Alteplase Aspirin  Heparin Medications for blood pressure Medications for depression Other medications used to treat angina Phenothiazines,  such as chlorpromazine, mesoridazine, prochlorperazine, thioridazine This list may not describe all possible interactions. Give your health care provider a list of all the medicines, herbs, non-prescription drugs, or dietary supplements you use. Also tell them if you smoke, drink alcohol, or use illegal drugs. Some items may interact with your medicine. What should I watch for while using this medication? Tell your care team if you feel your medication is no longer working. Keep this medication with you at all times. Sit or lie down when you take your medication to prevent falling if you feel dizzy or faint after using it. Try to remain calm. This will help you to feel better faster. If you feel dizzy, take several deep breaths and lie down with your feet propped up, or bend forward with your head resting between your knees. This medication may affect your coordination, reaction time, or judgment. Do not drive or operate machinery until you know how this medication affects you. Sit up or stand slowly to reduce the risk of dizzy or fainting  spells. Drinking alcohol with this medication can increase the risk of these side effects. Do not treat yourself for coughs, colds, or pain while you are taking this medication without asking your care team for advice. Some ingredients may increase your blood pressure. What side effects may I notice from receiving this medication? Side effects that you should report to your care team as soon as possible: Allergic reactions--skin rash, itching, hives, swelling of the face, lips, tongue, or throat Headache, unusual weakness or fatigue, shortness of breath, nausea, vomiting, rapid heartbeat, blue skin or lips, which may be signs of methemoglobinemia Increased pressure around the brain--severe headache, blurry vision, change in vision, nausea, vomiting Low blood pressure--dizziness, feeling faint or lightheaded, blurry vision Slow heartbeat--dizziness, feeling faint or  lightheaded, confusion, trouble breathing, unusual weakness or fatigue Worsening chest pain (angina)--pain, pressure, or tightness in the chest, neck, back, or arms Side effects that usually do not require medical attention (report to your care team if they continue or are bothersome): Dizziness Flushing Headache This list may not describe all possible side effects. Call your doctor for medical advice about side effects. You may report side effects to FDA at 1-800-FDA-1088. Where should I keep my medication? Keep out of the reach of children. Store at room temperature between 20 and 25 degrees C (68 and 77 degrees F). Store in retail buyer. Protect from light and moisture. Keep tightly closed. Throw away any unused medication after the expiration date. NOTE: This sheet is a summary. It may not cover all possible information. If you have questions about this medicine, talk to your doctor, pharmacist, or health care provider.  2024 Elsevier/Gold Standard (2022-12-12 00:00:00)

## 2024-12-30 NOTE — Progress Notes (Signed)
 " Cardiology Office Note:  .   Date:  12/30/2024  ID:  Casey Hutchinson, DOB 1966-07-13, MRN [id number] PCP: Ricky Alfrieda ONEIDA, DO  [facility] Providers Cardiologist:  Emeline FORBES Calender, DO    History of Present Illness: .   Casey BLACKERBY is a 59 y.o. male Discussed the use of AI scribe software for clinical note transcription with the patient, who gave verbal consent to proceed.  History of Present Illness Casey Hutchinson is a 59 year old male with obesity who presents with chest pain.  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 - Severe chest pain episode at night one week ago, improved with change in position  Shortness of breath and cough - Shortness of breath worsened after inhaling smoke from blowing wet leaves about 1.5 months ago - Shortness of breath occurs during physical activity - Steady cough present, believed to be related to shortness of breath - Chest pressure not consistently related to exertion - No history of smoking  Abdominal symptoms and weight gain - Bloating and abdominal swelling with weight gain of approximately twelve pounds - Abdomen feels hard - No belching or passing gas - No recent dairy consumption - Taking pantoprazole  regularly for acid reflux - Previous imaging (ultrasound of liver, CT abdomen) showed fatty liver, no other significant findings  Pulmonary findings - Chest x-ray at urgent care showed smaller lungs for body frame - CT scan of abdomen revealed scarring on right lung  Musculoskeletal symptoms - Leg cramps at night, particularly in the calves - Calves sensitive to touch  Cardiovascular risk factors - Obesity - Family history of  heart disease: father underwent open-heart surgery at age 39, both grandfathers had heart problems - No significant alcohol use  Endocrine therapy - Currently on testosterone therapy for male hypogonadism  Recent laboratory evaluation - Recent blood work performed at primary care visit, results pending      ROS: No other significant complaints  Studies Reviewed: .        Results  Risk Assessment/Calculations:             Physical Exam:   VS:  BP 130/78 (BP Location: Left Arm, Patient Position: Sitting, Cuff Size: Normal)   Pulse 62   Ht 5' 8 (1.727 m)   Wt 213 lb 8 oz (96.8 kg)   SpO2 97%   BMI 32.46 kg/m    Wt Readings from Last 3 Encounters:  12/30/24 213 lb 8 oz (96.8 kg)  11/20/24 205 lb (93 kg)  06/20/24 202 lb 4.8 oz (91.8 kg)    GEN: Well nourished, well developed in no acute distress NECK: No JVD; No carotid bruits CARDIAC: RRR, no murmurs, no rubs, no gallops RESPIRATORY:  Clear to auscultation without rales, wheezing or rhonchi  ABDOMEN: Soft, non-tender, non-distended EXTREMITIES:  No edema; No deformity, normal pulses of bilateral feet  ASSESSMENT AND PLAN: .    Assessment and Plan Assessment & Plan Evaluation of chest pain and shortness of breath Intermittent chest pressure and shortness of breath for several months, exacerbated by shortness of breath. Family history of coronary artery disease. 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 Previous imaging showed fatty liver and possible atelectasis in the right lung. No significant findings on recent chest x-ray. No smoking or significant alcohol use. No peripheral edema. Recent exposure to smoke from burning leaves may have exacerbated symptoms.  EKG unable to be signed in MUSE but shows borderline left axis with normal sinus rhythm with no pathologic ST changes - Ordered echocardiogram to  assess heart function. - Ordered coronary CTA to evaluate for coronary artery blockages.  Beta-blocker not needed as heart rate is 62 - Advised taking a baby aspirin  once daily. - Instructed to call 911 if experiencing crushing chest pain that does not resolve. - Provided nitroglycerin  for use if chest pressure or tightness does not resolve. - Scheduled follow-up appointment in one month. - If above workup is negative then would recommend follow-up with pulmonology and PFTs  Male hypogonadism on testosterone therapy Currently on testosterone therapy. Concerns about potential side effects, including hypertension. Decision to continue therapy pending further evaluation of chest pain and shortness of breath. - Continue testosterone therapy pending further evaluation of chest pain and shortness of breath.            Follow up: 4 to 6 weeks  Signed, Taygen Acklin E Lazarius Rivkin, DO  12/30/2024 3:51 PM    [facility] "

## 2024-12-30 NOTE — Progress Notes (Deleted)
" °  Cardiology Office Note:  .   Date:  12/30/2024  ID:  Casey Hutchinson, DOB 10-Nov-1966, MRN [id number] PCP: Patient, No Pcp Per  [facility] Providers Cardiologist:  None { Click to update primary MD,subspecialty MD or APP then REFRESH:1}   History of Present Illness: .   Casey Hutchinson is a 59 y.o. male Discussed the use of AI scribe software for clinical note transcription with the patient, who gave verbal consent to proceed.  History of Present Illness       ROS: ***  Studies Reviewed: .        Results  Risk Assessment/Calculations:   {Does this patient have ATRIAL FIBRILLATION?[phone]} No BP recorded.  {Refresh Note OR Click here to enter BP  :1}***       Physical Exam:   VS:  There were no vitals taken for this visit.   Wt Readings from Last 3 Encounters:  11/20/24 205 lb (93 kg)  06/20/24 202 lb 4.8 oz (91.8 kg)  10/27/23 202 lb 9.6 oz (91.9 kg)    GEN: Well nourished, well developed in no acute distress NECK: No JVD; No carotid bruits CARDIAC: ***RRR, no murmurs, no rubs, no gallops RESPIRATORY:  Clear to auscultation without rales, wheezing or rhonchi  ABDOMEN: Soft, non-tender, non-distended EXTREMITIES:  No edema; No deformity   ASSESSMENT AND PLAN: .    Assessment and Plan Assessment & Plan        {Are you ordering a CV Procedure (e.g. stress test, cath, DCCV, TEE, etc)?   Press F2        :[id number]}    Follow up: ***  Signed, Emeline FORBES Calender, DO  12/30/2024 1:23 PM    [facility] "

## 2025-01-07 ENCOUNTER — Encounter: Payer: Self-pay | Admitting: Pulmonary Disease

## 2025-01-07 ENCOUNTER — Ambulatory Visit: Admitting: Pulmonary Disease

## 2025-01-07 VITALS — BP 130/82 | HR 72 | Ht 68.0 in | Wt 216.0 lb

## 2025-01-07 DIAGNOSIS — R0602 Shortness of breath: Secondary | ICD-10-CM | POA: Diagnosis not present

## 2025-01-07 DIAGNOSIS — Z6832 Body mass index (BMI) 32.0-32.9, adult: Secondary | ICD-10-CM

## 2025-01-07 DIAGNOSIS — K219 Gastro-esophageal reflux disease without esophagitis: Secondary | ICD-10-CM

## 2025-01-07 DIAGNOSIS — E669 Obesity, unspecified: Secondary | ICD-10-CM | POA: Diagnosis not present

## 2025-01-07 DIAGNOSIS — J324 Chronic pansinusitis: Secondary | ICD-10-CM

## 2025-01-07 NOTE — Progress Notes (Signed)
 "  Established Patient Pulmonology Office Visit   Subjective:  Patient ID: Casey Hutchinson, male    DOB: 18-Dec-1966  MRN: [id number]  CC:  Chief Complaint  Patient presents with   Consult    Pt states SOB, UC Xray concern about lungs     Discussed the use of AI scribe software for clinical note transcription with the patient, who gave verbal consent to proceed.  History of Present Illness Casey Hutchinson is a 59 year old male who presents with shortness of breath. He is accompanied by his wife, [name].   He has had shortness of breath for 2 months, mainly with exertion at work and at times at rest. Talking worsens the dyspnea and triggers nonproductive coughing. He notes no wheezing or mucus with the cough.  In November, smoke exposure from burning wet leaves acutely worsened his breathing and led to an urgent care visit. He was treated with prednisone . An EKG was done and a chest x-ray reportedly mentioned scarring of the right lung.  He has acid reflux treated with daily pantoprazole .  His father had lung cancer and smoked. He has never smoked but had secondhand smoke exposure growing up. 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  He has nasal polyps and had surgery in March. He was prescribed Dupixent  for prevention of recurrence but has not used it for the past month. He occasionally uses a budesonide nasal rinse, especially after dust exposure.        Review of Systems  Constitutional:  Negative for chills, fever, malaise/fatigue and weight loss.  HENT:  Negative for congestion, sinus pain and sore throat.   Eyes: Negative.   Respiratory:  Positive for shortness of breath. Negative for cough, hemoptysis, sputum production and wheezing.   Cardiovascular:  Negative for chest pain, palpitations, orthopnea, claudication and leg swelling.  Gastrointestinal:  Positive for heartburn. Negative for abdominal pain, nausea and  vomiting.       Bloating  Genitourinary: Negative.   Musculoskeletal:  Negative for joint pain and myalgias.  Skin:  Negative for rash.  Neurological:  Negative for weakness.  Endo/Heme/Allergies: Negative.   Psychiatric/Behavioral: Negative.       Current Medications[1]      Objective:  BP 130/82   Pulse 72   Ht 5' 8 (1.727 m) Comment: per pt  Wt 216 lb (98 kg)   SpO2 95%   BMI 32.84 kg/m     Physical Exam Constitutional:      General: He is not in acute distress.    Appearance: Normal appearance. He is obese.  Eyes:     General: No scleral icterus.    Conjunctiva/sclera: Conjunctivae normal.  Cardiovascular:     Rate and Rhythm: Normal rate and regular rhythm.  Pulmonary:     Breath sounds: No wheezing, rhonchi or rales.  Musculoskeletal:     Right lower leg: No edema.     Left lower leg: No edema.  Skin:    General: Skin is warm and dry.  Neurological:     General: No focal deficit present.      Diagnostic Review:  Last CBC Lab Results  Component Value Date   WBC 5.8 08/04/2014   HGB 13.7 08/04/2014   HCT 40.0 08/04/2014   MCV 88.9 08/04/2014   MCH 30.4 08/04/2014   RDW 12.9 08/04/2014   PLT 176 08/04/2014   Last metabolic panel Lab Results  Component Value Date  GLUCOSE 111 (H) 08/05/2014   NA 140 08/05/2014   K 4.4 08/05/2014   CL 104 08/05/2014   CO2 28 08/05/2014   BUN 9 08/05/2014   CREATININE 0.97 08/05/2014   CALCIUM 8.2 (L) 08/05/2014   PROT 6.9 08/04/2014   ALBUMIN 3.7 08/04/2014   BILITOT 0.5 08/04/2014   ALKPHOS 36 (L) 08/04/2014   AST 59 (H) 08/04/2014   ALT 59 (H) 08/04/2014   ANIONGAP 8 08/05/2014   CT Abdomen 12/13/24 Reviewed with patient and wife. Lower lung fields normal. Linear scarring of right lower lobe. Concern for increased visceral fat leading abdominal distention.     Assessment & Plan:   Assessment & Plan Shortness of breath  Orders:   Pulmonary Function Test; Future  Chronic pansinusitis      Gastroesophageal reflux disease without esophagitis     BMI 32.0-32.9,adult      Assessment and Plan Assessment & Plan Shortness of breath Differential includes obstructive lung disease vs cardiac etiology vs obesity Cardiac evaluation pending. - Ordered pulmonary function test to evaluate for COPD and other lung abnormalities. - Advised use of albuterol  inhaler as needed. Patient preferred to hold off on trial of maintenance inhaler - Await results of cardiac evaluation.  Obesity with abdominal visceral fat accumulation Visceral fat possibly contributing to respiratory symptoms. Discussed dietary habits and weight loss benefits. - Advised dietary modifications including smaller, more frequent meals and reducing fast food intake. - Encouraged increased physical activity, starting with short walking sessions. - Discussed potential use of diet medications if lifestyle changes are insufficient, pending insurance approval.      Return in about 2 months (around 03/07/2025) for f/u visit Dr. Kara.   Dorn KATHEE Kara, MD     [1]  Current Outpatient Medications:    albuterol  (VENTOLIN  HFA) 108 (90 Base) MCG/ACT inhaler, Inhale 1-2 puffs into the lungs every 6 (six) hours as needed for wheezing or shortness of breath., Disp: 6.7 g, Rfl: 0   Ascorbic Acid (VITAMIN C) 1000 MG tablet, Take 1,000 mg by mouth daily., Disp: , Rfl:    aspirin  EC 81 MG tablet, Take 1 tablet (81 mg total) by mouth daily. Swallow whole., Disp: , Rfl:    azelastine  (ASTELIN ) 0.1 % nasal spray, Place 1 spray into both nostrils 2 (two) times daily. Use in each nostril as directed, Disp: 30 mL, Rfl: 1   budesonide (PULMICORT) 0.5 MG/2ML nebulizer solution, Take 0.5 mg by nebulization daily., Disp: , Rfl:    Cholecalciferol 50 MCG (2000 UT) CAPS, 1 capsule., Disp: , Rfl:    DUPIXENT  300 MG/2ML prefilled syringe, Inject 300 mg into the skin every 14 (fourteen) days., Disp: 4 mL, Rfl: 11   Magnesium 500 MG CAPS,  Take 500 mg by mouth daily., Disp: , Rfl:    Multiple Vitamin (MULTI VITAMIN) TABS, 1 tablet Orally Once a day, Disp: , Rfl:    Multiple Vitamins-Minerals (ONE-A-DAY MENS 50+ PO), Take by mouth., Disp: , Rfl:    nitroGLYCERIN  (NITROSTAT ) 0.4 MG SL tablet, Place 1 tablet (0.4 mg total) under the tongue every 5 (five) minutes as needed for chest pain., Disp: 25 tablet, Rfl: 3   pantoprazole  (PROTONIX ) 40 MG tablet, Take 40 mg by mouth daily., Disp: , Rfl:    Probiotic Product (ALIGN) 10 MG CAPS, Take by mouth., Disp: , Rfl:    promethazine -dextromethorphan (PROMETHAZINE -DM) 6.25-15 MG/5ML syrup, Take 10 mLs by mouth 3 (three) times daily as needed., Disp: 240 mL, Rfl: 0   sildenafil (  REVATIO) 20 MG tablet, Take 20 mg by mouth daily as needed., Disp: , Rfl:    vitamin E 180 MG (400 UNITS) capsule, Take 400 Units by mouth daily., Disp: , Rfl:    anastrozole (ARIMIDEX) 1 MG tablet, Take 1 mg by mouth once a week. (Patient not taking: Reported on 10/27/2023), Disp: , Rfl:    HYDROcodone -acetaminophen  (NORCO/VICODIN) 5-325 MG tablet, Take 1 tablet by mouth every 4 (four) hours as needed., Disp: , Rfl:    ondansetron  (ZOFRAN ) 4 MG tablet, Take 4 mg by mouth every 8 (eight) hours as needed., Disp: , Rfl:    testosterone cypionate (DEPOTESTOSTERONE CYPIONATE) 200 MG/ML injection, Compounded with [place] includes [id number] 0.85 CC Intramuscular Once per week, Disp: , Rfl:   "

## 2025-01-07 NOTE — Assessment & Plan Note (Signed)
 SABRA

## 2025-01-07 NOTE — Patient Instructions (Addendum)
 Use albuterol  inhaler 1-2 puffs every 4-6 hours as needed for shortness of breath  Schedule pulmonary function tests at the front desk   Recommend working on your diet to aid in weight loss  Agree with the cardiology work up  Based on the breathing tests and other tests, we will consider doing a CT Chest scan  Follow up in 2 months

## 2025-01-09 ENCOUNTER — Ambulatory Visit (HOSPITAL_COMMUNITY)

## 2025-01-16 ENCOUNTER — Ambulatory Visit

## 2025-01-27 ENCOUNTER — Ambulatory Visit: Admitting: Internal Medicine

## 2025-01-31 ENCOUNTER — Ambulatory Visit

## 2025-01-31 DIAGNOSIS — R0602 Shortness of breath: Secondary | ICD-10-CM

## 2025-01-31 LAB — PULMONARY FUNCTION TEST
DL/VA % pred: 121 %
DL/VA: 5.16 ml/min/mmHg/L
DLCO unc % pred: 107 %
DLCO unc: 28.44 ml/min/mmHg
FEF 25-75 Pre: 3.21 L/s
FEF2575-%Pred-Pre: 113 %
FEV1-%Pred-Pre: 85 %
FEV1-Pre: 2.91 L
FEV1FVC-%Pred-Pre: 109 %
FEV6-%Pred-Pre: 81 %
FEV6-Pre: 3.52 L
FEV6FVC-%Pred-Pre: 104 %
FVC-%Pred-Pre: 77 %
FVC-Pre: 3.52 L
Pre FEV1/FVC ratio: 83 %
Pre FEV6/FVC Ratio: 100 %
RV % pred: 112 %
RV: 2.43 L
TLC % pred: 91 %
TLC: 6.13 L

## 2025-01-31 NOTE — Progress Notes (Signed)
 Full pft w/o post performed today

## 2025-01-31 NOTE — Patient Instructions (Signed)
 Full pft w/o post performed today

## 2025-02-05 ENCOUNTER — Ambulatory Visit (HOSPITAL_COMMUNITY)

## 2025-02-18 ENCOUNTER — Ambulatory Visit: Admitting: Cardiology

## 2025-03-18 ENCOUNTER — Ambulatory Visit: Admitting: Pulmonary Disease
